# Patient Record
Sex: Female | Born: 1975 | Race: White | Hispanic: No | Smoking: Former smoker
Health system: Southern US, Community
[De-identification: ages and names within clinical notes are randomized; demographics above are authoritative.]

## PROBLEM LIST (undated history)

## (undated) DIAGNOSIS — F419 Anxiety disorder, unspecified: Secondary | ICD-10-CM

## (undated) DIAGNOSIS — I10 Essential (primary) hypertension: Secondary | ICD-10-CM

## (undated) DIAGNOSIS — F32A Depression, unspecified: Secondary | ICD-10-CM

## (undated) HISTORY — PX: TUBAL LIGATION: SHX77

## (undated) HISTORY — PX: BREAST BIOPSY: SHX20

## (undated) HISTORY — DX: Essential (primary) hypertension: I10

## (undated) HISTORY — PX: ABDOMINAL HYSTERECTOMY: SHX81

## (undated) HISTORY — PX: PARTIAL HYSTERECTOMY: SHX80

---

## 2011-01-01 ENCOUNTER — Other Ambulatory Visit: Payer: Self-pay | Admitting: Unknown Physician Specialty

## 2011-01-01 DIAGNOSIS — Z1231 Encounter for screening mammogram for malignant neoplasm of breast: Secondary | ICD-10-CM

## 2011-01-01 DIAGNOSIS — Z803 Family history of malignant neoplasm of breast: Secondary | ICD-10-CM

## 2011-01-17 ENCOUNTER — Ambulatory Visit
Admission: RE | Admit: 2011-01-17 | Discharge: 2011-01-17 | Disposition: A | Discharge status: Home / Self Care | Payer: BC Managed Care – PPO | Source: Ambulatory Visit | Attending: Unknown Physician Specialty | Admitting: Unknown Physician Specialty

## 2011-01-17 DIAGNOSIS — Z803 Family history of malignant neoplasm of breast: Secondary | ICD-10-CM

## 2011-01-17 DIAGNOSIS — Z1231 Encounter for screening mammogram for malignant neoplasm of breast: Secondary | ICD-10-CM

## 2011-01-17 MED ORDER — GADOBENATE DIMEGLUMINE 529 MG/ML IV SOLN
14.0000 mL | Freq: Once | INTRAVENOUS | Status: AC | PRN
  Administered 2011-01-17: 14 mL via INTRAVENOUS

## 2016-01-16 ENCOUNTER — Encounter: Payer: Self-pay | Admitting: Internal Medicine

## 2016-01-29 ENCOUNTER — Encounter: Payer: Self-pay | Admitting: Nurse Practitioner

## 2016-01-29 ENCOUNTER — Ambulatory Visit (INDEPENDENT_AMBULATORY_CARE_PROVIDER_SITE_OTHER): Payer: Commercial Managed Care - HMO | Admitting: Nurse Practitioner

## 2016-01-29 DIAGNOSIS — R140 Abdominal distension (gaseous): Secondary | ICD-10-CM | POA: Diagnosis not present

## 2016-01-29 DIAGNOSIS — R1084 Generalized abdominal pain: Secondary | ICD-10-CM

## 2016-01-29 DIAGNOSIS — R109 Unspecified abdominal pain: Secondary | ICD-10-CM | POA: Insufficient documentation

## 2016-01-29 DIAGNOSIS — R110 Nausea: Secondary | ICD-10-CM

## 2016-01-29 LAB — CBC WITH DIFFERENTIAL/PLATELET
BASOS ABS: 0 {cells}/uL (ref 0–200)
Basophils Relative: 0 %
EOS ABS: 69 {cells}/uL (ref 15–500)
Eosinophils Relative: 1 %
HEMATOCRIT: 38 % (ref 35.0–45.0)
HEMOGLOBIN: 12.9 g/dL (ref 11.7–15.5)
LYMPHS ABS: 1725 {cells}/uL (ref 850–3900)
LYMPHS PCT: 25 %
MCH: 29.8 pg (ref 27.0–33.0)
MCHC: 33.9 g/dL (ref 32.0–36.0)
MCV: 87.8 fL (ref 80.0–100.0)
MONO ABS: 414 {cells}/uL (ref 200–950)
MPV: 9.8 fL (ref 7.5–12.5)
Monocytes Relative: 6 %
NEUTROS PCT: 68 %
Neutro Abs: 4692 cells/uL (ref 1500–7800)
Platelets: 298 10*3/uL (ref 140–400)
RBC: 4.33 MIL/uL (ref 3.80–5.10)
RDW: 13.1 % (ref 11.0–15.0)
WBC: 6.9 10*3/uL (ref 3.8–10.8)

## 2016-01-29 NOTE — Assessment & Plan Note (Signed)
The patient complains of right upper quadrant pain. On exam tenderness is noted right side, right lower quadrant. No epigastric tenderness. Was given PPI which she said did not improve but then later admitted that she does not really take it. Takes over-the-counter ranitidine instead. Takes NSAIDs about 2-3 times a week, no aspirin powders. Does consume some dairy but not significant amount. Has persistent bloating as well. She has an increased frequency in stools since this all started whereas she previously had 2-3 bowel movements a week now 52-3 a day, stools are softer in consistency. No heme noted in her blood. No family history of colon cancer. Abdomen and pelvis CT normal, right upper quadrant ultrasound normal, HIDA scan with 90% ejection fraction. All workup to this point has been essentially benign.  Possible differentials include gluten sensitivity, lactose intolerance, upper GI erosions, gastritis, esophagitis, colon abnormality.   At this point I will check labs including CBC, CMP, tissue transglutaminase IgA, CRP, and an iFob. Recommend she take her PPI as recommended to "given a chance." I'll recommend she avoid dairy as a trial. Return for follow-up in 4 weeks to further evaluate.

## 2016-01-29 NOTE — Assessment & Plan Note (Signed)
Associated bloating with eating, this lungs itself the possibility of dietary intolerance as a component of her problems. We will have her avoid dairy and I'll check a tissue transglutaminase IgA for celiac sensitivity. Return for follow-up in 4 weeks to further evaluate.

## 2016-01-29 NOTE — Progress Notes (Signed)
cc'ed to pcp °

## 2016-01-29 NOTE — Progress Notes (Signed)
Primary Care Physician:  Glenda Chroman, MD Primary Gastroenterologist:  Dr. Gala Romney  Chief Complaint  Patient presents with  . Abdominal Pain    HPI:   Melanie Cochran is a 40 y.o. female who presents On referral from primary care for persistent abdominal pain. The patient last saw primary care on 01/11/2016 for recheck of abdominal pain. Symptoms include abdominal pain and nausea without diarrhea or vaginal discharge. The pain is right upper quadrant described as burning, moderate in severity and unchanged. Symptoms do not include bloody stool. Extensive workup thus far including HIDA scan which calculated all bladder ejection fraction at 90%, abdominal ultrasound with normal right upper quadrant abdominal sonogram with no cholelithiasis, CT abdomen and pelvis findings included no abnormality in the abdomen or pelvis. Labs drawn 05/16/2015 include CBC which is normal and CMP which was mostly normal with the exception of mildly elevated alkaline phosphatase at 120 (high normal 117). Her symptoms do not appear related to food. She was started on a PPI and symptoms continue. She does have a history of hysterectomy.  Today she states she's doing ok overall. Abdominal pain started sometime before March of this year, always right-sided with occasional radiation to her back. PPI has not given her any relief although she isn't taking the PPI. Is taking OTC Ranitidine when the pain is severe. Pain is variable in character, from crampy to burning to sharp . More "annoying" than severe, likely mild to moderate. Some days worse than others and some days no pain until eating then the pain will start. Her "whole life" has had a bowel movement 2-3 times a week and since this started she goes 2-3 times a day, stools are softer as well. Also with nausea but unable to answer if it's related to her abdominal pain. Denies vomiting, hematochezia, melena. Has had low grade (99-100) fever since all this started (states she's  had a low grade fever with every visit to the doctor.) Eats minimal dairy (occasionally in cereal, mac and cheese, or mashed potatoes), occasional cheese. Denies chest pain, dyspnea, dizziness, lightheadedness, syncope, near syncope. Denies any other upper or lower GI symptoms.  Takes NSAIDs about 2-3 times a week, denies ASA powders.   No past medical history on file.  Past Surgical History:  Procedure Laterality Date  . BREAST BIOPSY Right   . CESAREAN SECTION    . PARTIAL HYSTERECTOMY    . TUBAL LIGATION      Current Outpatient Prescriptions  Medication Sig Dispense Refill  . hydrochlorothiazide (HYDRODIURIL) 25 MG tablet      No current facility-administered medications for this visit.     Allergies as of 01/29/2016 - Review Complete 01/29/2016  Allergen Reaction Noted  . Penicillins Other (See Comments) 01/29/2016    Family History  Problem Relation Age of Onset  . Colon cancer Neg Hx     Social History   Social History  . Marital status: Divorced    Spouse name: N/A  . Number of children: N/A  . Years of education: N/A   Occupational History  . Not on file.   Social History Main Topics  . Smoking status: Former Smoker    Quit date: 05/16/2015  . Smokeless tobacco: Never Used  . Alcohol use Yes     Comment: socially  . Drug use: No  . Sexual activity: Not on file   Other Topics Concern  . Not on file   Social History Narrative  . No narrative on  file    Review of Systems: General: Negative for anorexia, weight loss, fever, chills, fatigue, weakness. ENT: Negative for hoarseness, difficulty swallowing , nasal congestion. CV: Negative for chest pain, angina, palpitations, peripheral edema.  Respiratory: Negative for dyspnea at rest, cough, sputum, wheezing.  GI: See history of present illness. GU:  Negative for dysuria, hematuria.  MS: Negative for joint pain, low back pain.  Derm: Negative for rash or itching.  Endo: Negative for unusual weight  change.  Heme: Negative for bruising or bleeding. Allergy: Negative for rash or hives.    Physical Exam: BP (!) 136/93   Pulse 79   Temp 98.3 F (36.8 C) (Oral)   Ht 5\' 6"  (1.676 m)   Wt 182 lb 6.4 oz (82.7 kg)   BMI 29.44 kg/m  General:   Alert and oriented. Pleasant and cooperative. Well-nourished and well-developed.  Head:  Normocephalic and atraumatic. Eyes:  Without icterus, sclera clear and conjunctiva pink.  Ears:  Normal auditory acuity. Cardiovascular:  S1, S2 present without murmurs appreciated. Extremities without clubbing or edema. Respiratory:  Clear to auscultation bilaterally. No wheezes, rales, or rhonchi. No distress.  Gastrointestinal:  +BS, soft, and non-distended. Mild TTP RLQ and right flank area; no TTP epigastrum or RUQ. No HSM noted. No guarding or rebound. No masses appreciated.  Rectal:  Deferred  Musculoskalatal:  Symmetrical without gross deformities. Skin:  Intact without significant lesions or rashes. Neurologic:  Alert and oriented x4;  grossly normal neurologically. Psych:  Alert and cooperative. Normal mood and affect. Heme/Lymph/Immune: No excessive bruising noted.    01/29/2016 9:01 AM   Disclaimer: This note was dictated with voice recognition software. Similar sounding words can inadvertently be transcribed and may not be corrected upon review.

## 2016-01-29 NOTE — Patient Instructions (Signed)
1. Other labs drawn when you're able to. 2. When you collect the stool sample bring her back to our office. 3. Try to avoid all dairy for the next 4 weeks. 4. I recommend you take the acid blocker your prescribed by your primary care provider daily. This will help [place] to rule out any kind of stomach/esophageal problems that could be contributing to your symptoms. 5. Return for follow-up in 4 weeks

## 2016-01-29 NOTE — Assessment & Plan Note (Signed)
He does complain of nausea as an associated symptom, no vomiting. This lends the possibility of at least a component of her symptoms being related to GERD, esophagitis, or gastritis. I recommend she take her PPIs recommended, avoid NSAIDs.

## 2016-01-30 LAB — HEPATIC FUNCTION PANEL
ALBUMIN: 4.2 g/dL (ref 3.6–5.1)
ALT: 14 U/L (ref 6–29)
AST: 14 U/L (ref 10–30)
Alkaline Phosphatase: 91 U/L (ref 33–115)
BILIRUBIN DIRECT: 0.1 mg/dL (ref <=0.2)
Indirect Bilirubin: 0.4 mg/dL (ref 0.2–1.2)
TOTAL PROTEIN: 7.1 g/dL (ref 6.1–8.1)
Total Bilirubin: 0.5 mg/dL (ref 0.2–1.2)

## 2016-01-30 LAB — C-REACTIVE PROTEIN: CRP: 11.7 mg/L — AB (ref <8.0)

## 2016-02-04 ENCOUNTER — Ambulatory Visit (INDEPENDENT_AMBULATORY_CARE_PROVIDER_SITE_OTHER): Payer: Commercial Managed Care - HMO

## 2016-02-04 DIAGNOSIS — R109 Unspecified abdominal pain: Secondary | ICD-10-CM | POA: Diagnosis not present

## 2016-02-04 LAB — IFOBT (OCCULT BLOOD): IFOBT: NEGATIVE

## 2016-02-05 ENCOUNTER — Encounter: Payer: Self-pay | Admitting: Nurse Practitioner

## 2016-02-07 LAB — HLA TYPING FOR CELIAC DISEASE
HLA-DQ2: NEGATIVE
HLA-DQ8: NEGATIVE
HLA-DQA1*: 1
HLA-DQA1: 1
HLA-DQB1*: 501
HLA-DQB1: 603

## 2016-02-26 ENCOUNTER — Encounter: Payer: Self-pay | Admitting: Internal Medicine

## 2016-02-26 ENCOUNTER — Ambulatory Visit (INDEPENDENT_AMBULATORY_CARE_PROVIDER_SITE_OTHER): Payer: Commercial Managed Care - HMO | Admitting: Internal Medicine

## 2016-02-26 VITALS — BP 120/86 | HR 77 | Temp 98.1°F | Ht 66.0 in | Wt 181.4 lb

## 2016-02-26 DIAGNOSIS — R140 Abdominal distension (gaseous): Secondary | ICD-10-CM

## 2016-02-26 DIAGNOSIS — G8929 Other chronic pain: Secondary | ICD-10-CM | POA: Diagnosis not present

## 2016-02-26 DIAGNOSIS — R1011 Right upper quadrant pain: Secondary | ICD-10-CM | POA: Diagnosis not present

## 2016-02-26 NOTE — Progress Notes (Signed)
Primary Care Physician:  Glenda Chroman, MD Primary Gastroenterologist:  Dr. Gala Romney  Pre-Procedure History & Physical: HPI:  Melanie Cochran is a 40 y.o. female here for follow-up of bloating, postprandial right upper quadrant abdominal pain. Gallbladder workup negative. No improvement with PPI therapy. He has 2-3 bowel movements weekly. Does not perceive constipation. Associated nausea but no vomiting, no bleeding. CRP mildly elevated 11.7. Celiac markers in the way of HLA testing negative. Mildly elevated ALP which reverted to normal.  Past Medical History:  Diagnosis Date  . Hypertension     Past Surgical History:  Procedure Laterality Date  . BREAST BIOPSY Right   . CESAREAN SECTION    . PARTIAL HYSTERECTOMY    . TUBAL LIGATION      Prior to Admission medications   Medication Sig Start Date End Date Taking? Authorizing Provider  hydrochlorothiazide (HYDRODIURIL) 25 MG tablet  01/21/16  Yes Historical Provider, MD  Probiotic Product (PROBIOTIC DAILY PO) Take by mouth daily.   Yes Historical Provider, MD  pantoprazole (PROTONIX) 40 MG tablet  01/11/16   Historical Provider, MD    Allergies as of 02/26/2016 - Review Complete 02/26/2016  Allergen Reaction Noted  . Penicillins Other (See Comments) 01/29/2016    Family History  Problem Relation Age of Onset  . Colon cancer Neg Hx     Social History   Social History  . Marital status: Divorced    Spouse name: N/A  . Number of children: N/A  . Years of education: N/A   Occupational History  . Not on file.   Social History Main Topics  . Smoking status: Former Smoker    Quit date: 05/16/2015  . Smokeless tobacco: Never Used  . Alcohol use Yes     Comment: socially  . Drug use: No  . Sexual activity: Not on file   Other Topics Concern  . Not on file   Social History Narrative  . No narrative on file    Review of Systems: See HPI, otherwise negative ROS  Physical Exam: BP 120/86   Pulse 77   Temp 98.1 F  (36.7 C) (Oral)   Ht 5' 6" (1.676 m)   Wt 181 lb 6.4 oz (82.3 kg)   BMI 29.28 kg/m  General:   Alert,  Well-developed, well-nourished, pleasant and cooperative in NAD Skin:  Intact without significant lesions or rashes. Eyes:  Sclera clear, no icterus.   Conjunctiva pink. Neck:  Supple; no masses or thyromegaly. No significant cervical adenopathy. Lungs:  Clear throughout to auscultation.   No wheezes, crackles, or rhonchi. No acute distress. Heart:  Regular rate and rhythm; no murmurs, clicks, rubs,  or gallops. Abdomen: Non-distended, normal bowel sounds.  Soft and nontender without appreciable mass or hepatosplenomegaly.  Pulses:  Normal pulses noted. Extremities:  Without clubbing or edema.  Impression: 40 year old lady with postprandial right upper quadrant abdominal pain. Bowel function a bit sluggish but no change perceived from lifetime baseline.. She tends to have about 3 bowel movements weekly. She may not be actual evacuating adequately. However. I doubt food allergies.  Gallbladder etiology not absolutely ruled out despite a negative ultrasound, HIDA.  Recommendations:   Trial of Linzess 72 - one daily x 3 weeks to see a better evacuation makes any difference in her symptoms.  If no improvement, will need an EGD prior to referral for potential cholecystectomy as the next diagnostic maneuver.  Further recommendations to follow    Notice: This dictation was prepared  with Dragon dictation along with smaller phrase technology. Any transcriptional errors that result from this process are unintentional and may not be corrected upon review.

## 2016-02-26 NOTE — Patient Instructions (Signed)
Trial of Linzess 72 - one daily x 3 weeks  If no improvement, will need an EGD prior to referral for potential cholecystectomy  Further recommendations to follow

## 2016-03-05 ENCOUNTER — Telehealth: Payer: Self-pay

## 2016-03-05 ENCOUNTER — Encounter: Payer: Self-pay | Admitting: Internal Medicine

## 2016-03-05 NOTE — Telephone Encounter (Signed)
Sent pt a message via mychart. Ginger, please schedule EGD.

## 2016-03-05 NOTE — Telephone Encounter (Signed)
Schedule EGD

## 2016-03-05 NOTE — Telephone Encounter (Signed)
Pt sent a mychart message, she said the linzess hasnt helped her. She wants to know if she can go ahead and schedule an EGD. She has almost met her deductible for the year and would like to go ahead and have it done.   Is it ok to schedule EGD?

## 2016-03-06 ENCOUNTER — Other Ambulatory Visit: Payer: Self-pay

## 2016-03-06 DIAGNOSIS — R1011 Right upper quadrant pain: Secondary | ICD-10-CM

## 2016-03-06 NOTE — Telephone Encounter (Signed)
Pt is set up on 03/12/16 she is aware and instructions are in the mail

## 2016-03-11 ENCOUNTER — Telehealth: Payer: Self-pay

## 2016-03-11 NOTE — Telephone Encounter (Signed)
PA #  [id number]

## 2016-03-12 ENCOUNTER — Ambulatory Visit (HOSPITAL_COMMUNITY)
Admission: RE | Admit: 2016-03-12 | Discharge: 2016-03-12 | Disposition: A | Discharge status: Home / Self Care | Payer: Commercial Managed Care - HMO | Source: Ambulatory Visit | Attending: Internal Medicine | Admitting: Internal Medicine

## 2016-03-12 ENCOUNTER — Encounter (HOSPITAL_COMMUNITY)
Admission: RE | Disposition: A | Discharge status: Home / Self Care | Payer: Self-pay | Source: Ambulatory Visit | Attending: Internal Medicine

## 2016-03-12 ENCOUNTER — Encounter (HOSPITAL_COMMUNITY): Payer: Self-pay | Admitting: *Deleted

## 2016-03-12 ENCOUNTER — Telehealth: Payer: Self-pay

## 2016-03-12 ENCOUNTER — Other Ambulatory Visit: Payer: Self-pay

## 2016-03-12 DIAGNOSIS — Z79899 Other long term (current) drug therapy: Secondary | ICD-10-CM | POA: Diagnosis not present

## 2016-03-12 DIAGNOSIS — Z9889 Other specified postprocedural states: Secondary | ICD-10-CM | POA: Diagnosis not present

## 2016-03-12 DIAGNOSIS — R1011 Right upper quadrant pain: Secondary | ICD-10-CM

## 2016-03-12 DIAGNOSIS — Z880 Allergy status to penicillin: Secondary | ICD-10-CM | POA: Diagnosis not present

## 2016-03-12 DIAGNOSIS — K5900 Constipation, unspecified: Secondary | ICD-10-CM | POA: Diagnosis not present

## 2016-03-12 DIAGNOSIS — Z90710 Acquired absence of both cervix and uterus: Secondary | ICD-10-CM | POA: Insufficient documentation

## 2016-03-12 DIAGNOSIS — Z87891 Personal history of nicotine dependence: Secondary | ICD-10-CM | POA: Diagnosis not present

## 2016-03-12 DIAGNOSIS — R1013 Epigastric pain: Secondary | ICD-10-CM | POA: Diagnosis not present

## 2016-03-12 DIAGNOSIS — I10 Essential (primary) hypertension: Secondary | ICD-10-CM | POA: Insufficient documentation

## 2016-03-12 HISTORY — PX: ESOPHAGOGASTRODUODENOSCOPY: SHX5428

## 2016-03-12 SURGERY — EGD (ESOPHAGOGASTRODUODENOSCOPY)
Anesthesia: Moderate Sedation

## 2016-03-12 MED ORDER — MIDAZOLAM HCL 5 MG/5ML IJ SOLN
INTRAMUSCULAR | Status: AC
  Filled 2016-03-12: qty 10

## 2016-03-12 MED ORDER — MIDAZOLAM HCL 5 MG/5ML IJ SOLN
INTRAMUSCULAR | Status: DC | PRN
  Administered 2016-03-12: 1 mg via INTRAVENOUS
  Administered 2016-03-12 (×2): 2 mg via INTRAVENOUS

## 2016-03-12 MED ORDER — LIDOCAINE VISCOUS 2 % MT SOLN
OROMUCOSAL | Status: AC
  Filled 2016-03-12: qty 15

## 2016-03-12 MED ORDER — SODIUM CHLORIDE 0.9 % IV SOLN
INTRAVENOUS | Status: DC
  Administered 2016-03-12: 1000 mL via INTRAVENOUS

## 2016-03-12 MED ORDER — ONDANSETRON HCL 4 MG/2ML IJ SOLN
INTRAMUSCULAR | Status: DC | PRN
  Administered 2016-03-12: 4 mg via INTRAVENOUS

## 2016-03-12 MED ORDER — LIDOCAINE VISCOUS 2 % MT SOLN
OROMUCOSAL | Status: DC | PRN
  Administered 2016-03-12: 3 mL via OROMUCOSAL

## 2016-03-12 MED ORDER — ONDANSETRON HCL 4 MG/2ML IJ SOLN
INTRAMUSCULAR | Status: AC
  Filled 2016-03-12: qty 2

## 2016-03-12 MED ORDER — SIMETHICONE 40 MG/0.6ML PO SUSP
ORAL | Status: DC | PRN
  Administered 2016-03-12: 09:00:00

## 2016-03-12 MED ORDER — MEPERIDINE HCL 100 MG/ML IJ SOLN
INTRAMUSCULAR | Status: DC | PRN
  Administered 2016-03-12 (×2): 50 mg via INTRAVENOUS

## 2016-03-12 MED ORDER — MEPERIDINE HCL 100 MG/ML IJ SOLN
INTRAMUSCULAR | Status: AC
  Filled 2016-03-12: qty 2

## 2016-03-12 NOTE — Telephone Encounter (Signed)
Referral info faxed to Dr. Jenkins office. 

## 2016-03-12 NOTE — Op Note (Signed)
[facility] Patient Name: Melanie Cochran Procedure Date: 03/12/2016 8:58 AM MRN: [id number] Date of Birth: [birth year] Attending MD: Norvel Richards , MD CSN: [id number] Age: 40 Admit Type: Outpatient Procedure:                Upper GI endoscopy Indications:              Epigastric abdominal pain, Abdominal pain in the                            right upper quadrant Providers:                Norvel Richards, MD Referring MD:              Medicines:                Midazolam 5 mg IV, Meperidine 100 mg IV,                            Ondansetron 4 mg IV Complications:            No immediate complications. Estimated Blood Loss:     Estimated blood loss: none. Procedure:                Pre-Anesthesia Assessment:                           - Prior to the procedure, a History and Physical                            was performed, and patient medications and                            allergies were reviewed. The patient's tolerance of                            previous anesthesia was also reviewed. The risks                            and benefits of the procedure and the sedation                            options and risks were discussed with the patient.                            All questions were answered, and informed consent                            was obtained. Prior Anticoagulants: The patient has                            taken no previous anticoagulant or antiplatelet                            agents. ASA Grade Assessment: II - A patient with  mild systemic disease. After reviewing the risks                            and benefits, the patient was deemed in                            satisfactory condition to undergo the procedure.                           After obtaining informed consent, the endoscope was                            passed under direct vision. Throughout the                            procedure, the patient's blood  pressure, pulse, and                            oxygen saturations were monitored continuously.The                            upper GI endoscopy was accomplished without                            difficulty. The patient tolerated the procedure                            well. The [id number]) scope was introduced                            through the and advanced to the second part of                            duodenum. Scope In: Scope Out: Findings:      The examined esophagus was normal.      The entire examined stomach was normal.      The duodenal bulb and second portion of the duodenum were normal. Impression:               - Normal esophagus.                           - Normal stomach.                           - Normal duodenal bulb and second portion of the                            duodenum.                           - No specimens collected. Patient remains                            constipated. Low-dose Linzess did not help. Before  sending her to a surgeon for consideration of                            cholecystectomy as a DIAGNOSTIC maneuver, would                            like to see if more effective management of                            constipation diminishes her abdominal pain. Moderate Sedation:      Moderate (conscious) sedation was administered by the endoscopy nurse       and supervised by the endoscopist. The following parameters were       monitored: oxygen saturation, heart rate, blood pressure, respiratory       rate, EKG, adequacy of pulmonary ventilation, and response to care.       Total physician intraservice time was 14 minutes. Recommendation:           - Patient has a contact number available for                            emergencies. The signs and symptoms of potential                            delayed complications were discussed with the                            patient. Return to normal activities  tomorrow.                            Written discharge instructions were provided to the                            patient.                           - Resume previous diet.                           - Continue present medications. Increase Linzess to                            145 daily; go by my office for samples; appointment                            to see Dr. Arnoldo Morale in about 2 weeks for evaluation                            for possible cholecystectomy if needed.                           - No repeat upper endoscopy.                           - Procedure Code(s):        ---  Professional ---                           [phone], Moderate sedation services provided by the                            same physician or other qualified health care                            professional performing the diagnostic or                            therapeutic service that the sedation supports,                            requiring the presence of an independent trained                            observer to assist in the monitoring of the                            patient's level of consciousness and physiological                            status; initial 15 minutes of intraservice time,                            patient age 82 years or older Diagnosis Code(s):        --- Professional ---                           R10.13, Epigastric pain                           R10.11, Right upper quadrant pain CPT copyright 2016 [employer] Medical Association. All rights reserved. The codes documented in this report are preliminary and upon coder review may  be revised to meet current compliance requirements. Cristopher Estimable. Melanie Duclos, MD Norvel Richards, MD 03/12/2016 12:49:12 PM This report has been signed electronically. Number of Addenda: 0

## 2016-03-12 NOTE — Telephone Encounter (Signed)
Tammy from Endo called- pt needs a consult with Dr.Jenkins in 7-10 days for possible cholecystectomy.   Pt also needs linzess 1100mcg samples. #4 boxes are at the front desk for pt to pick up.

## 2016-03-12 NOTE — H&P (View-Only) (Signed)
Primary Care Physician:  Glenda Chroman, MD Primary Gastroenterologist:  Dr. Gala Romney  Pre-Procedure History & Physical: HPI:  Melanie Cochran is a 40 y.o. female here for follow-up of bloating, postprandial right upper quadrant abdominal pain. Gallbladder workup negative. No improvement with PPI therapy. He has 2-3 bowel movements weekly. Does not perceive constipation. Associated nausea but no vomiting, no bleeding. CRP mildly elevated 11.7. Celiac markers in the way of HLA testing negative. Mildly elevated ALP which reverted to normal.  Past Medical History:  Diagnosis Date  . Hypertension     Past Surgical History:  Procedure Laterality Date  . BREAST BIOPSY Right   . CESAREAN SECTION    . PARTIAL HYSTERECTOMY    . TUBAL LIGATION      Prior to Admission medications   Medication Sig Start Date End Date Taking? Authorizing Provider  hydrochlorothiazide (HYDRODIURIL) 25 MG tablet  01/21/16  Yes Historical Provider, MD  Probiotic Product (PROBIOTIC DAILY PO) Take by mouth daily.   Yes Historical Provider, MD  pantoprazole (PROTONIX) 40 MG tablet  01/11/16   Historical Provider, MD    Allergies as of 02/26/2016 - Review Complete 02/26/2016  Allergen Reaction Noted  . Penicillins Other (See Comments) 01/29/2016    Family History  Problem Relation Age of Onset  . Colon cancer Neg Hx     Social History   Social History  . Marital status: Divorced    Spouse name: N/A  . Number of children: N/A  . Years of education: N/A   Occupational History  . Not on file.   Social History Main Topics  . Smoking status: Former Smoker    Quit date: 05/16/2015  . Smokeless tobacco: Never Used  . Alcohol use Yes     Comment: socially  . Drug use: No  . Sexual activity: Not on file   Other Topics Concern  . Not on file   Social History Narrative  . No narrative on file    Review of Systems: See HPI, otherwise negative ROS  Physical Exam: BP 120/86   Pulse 77   Temp 98.1 F  (36.7 C) (Oral)   Ht 5' 6" (1.676 m)   Wt 181 lb 6.4 oz (82.3 kg)   BMI 29.28 kg/m  General:   Alert,  Well-developed, well-nourished, pleasant and cooperative in NAD Skin:  Intact without significant lesions or rashes. Eyes:  Sclera clear, no icterus.   Conjunctiva pink. Neck:  Supple; no masses or thyromegaly. No significant cervical adenopathy. Lungs:  Clear throughout to auscultation.   No wheezes, crackles, or rhonchi. No acute distress. Heart:  Regular rate and rhythm; no murmurs, clicks, rubs,  or gallops. Abdomen: Non-distended, normal bowel sounds.  Soft and nontender without appreciable mass or hepatosplenomegaly.  Pulses:  Normal pulses noted. Extremities:  Without clubbing or edema.  Impression: 40 year old lady with postprandial right upper quadrant abdominal pain. Bowel function a bit sluggish but no change perceived from lifetime baseline.. She tends to have about 3 bowel movements weekly. She may not be actual evacuating adequately. However. I doubt food allergies.  Gallbladder etiology not absolutely ruled out despite a negative ultrasound, HIDA.  Recommendations:   Trial of Linzess 72 - one daily x 3 weeks to see a better evacuation makes any difference in her symptoms.  If no improvement, will need an EGD prior to referral for potential cholecystectomy as the next diagnostic maneuver.  Further recommendations to follow    Notice: This dictation was prepared  with Dragon dictation along with smaller phrase technology. Any transcriptional errors that result from this process are unintentional and may not be corrected upon review.

## 2016-03-12 NOTE — Discharge Instructions (Addendum)
EGD Discharge instructions Please read the instructions outlined below and refer to this sheet in the next few weeks. These discharge instructions provide you with general information on caring for yourself after you leave the hospital. Your doctor may also give you specific instructions. While your treatment has been planned according to the most current medical practices available, unavoidable complications occasionally occur. If you have any problems or questions after discharge, please call your doctor. ACTIVITY  You may resume your regular activity but move at a slower pace for the next 24 hours.   Take frequent rest periods for the next 24 hours.   Walking will help expel (get rid of) the air and reduce the bloated feeling in your abdomen.   No driving for 24 hours (because of the anesthesia (medicine) used during the test).   You may shower.   Do not sign any important legal documents or operate any machinery for 24 hours (because of the anesthesia used during the test).  NUTRITION  Drink plenty of fluids.   You may resume your normal diet.   Begin with a light meal and progress to your normal diet.   Avoid alcoholic beverages for 24 hours or as instructed by your caregiver.  MEDICATIONS  You may resume your normal medications unless your caregiver tells you otherwise.  WHAT YOU CAN EXPECT TODAY  You may experience abdominal discomfort such as a feeling of fullness or gas pains.  FOLLOW-UP  Your doctor will discuss the results of your test with you.  SEEK IMMEDIATE MEDICAL ATTENTION IF ANY OF THE FOLLOWING OCCUR:  Excessive nausea (feeling sick to your stomach) and/or vomiting.   Severe abdominal pain and distention (swelling).   Trouble swallowing.   Temperature over 101 F (37.8 C).   Rectal bleeding or vomiting of blood.   Increase Linzess to 145 daily for the next 7-10 days to see if this helps with constipation and upper abdominal pain. Go to the office  for free samples.  Will arrange an appointment to see Dr. Aviva Signs in about 7-10 days for consideration of cholecystectomy if needed.

## 2016-03-12 NOTE — Interval H&P Note (Signed)
History and Physical Interval Note:  03/12/2016 9:05 AM  Leone Brand  has presented today for surgery, with the diagnosis of potentrial cholecystectomy/RUQ PAIN  The various methods of treatment have been discussed with the patient and family. After consideration of risks, benefits and other options for treatment, the patient has consented to  Procedure(s) with comments: ESOPHAGOGASTRODUODENOSCOPY (EGD) (N/A) - [id number] as a surgical intervention .  The patient's history has been reviewed, patient examined, no change in status, stable for surgery.  I have reviewed the patient's chart and labs.  Questions were answered to the patient's satisfaction.     Remains constipated in spite of Linzess 72. Diagnostic EGD today per plan. The risks, benefits, limitations, alternatives and imponderables have been reviewed with the patient. Potential for esophageal dilation, biopsy, etc. have also been reviewed.  Questions have been answered. All parties agreeable.  Manus Rudd

## 2016-03-14 ENCOUNTER — Encounter (HOSPITAL_COMMUNITY): Payer: Self-pay | Admitting: Internal Medicine

## 2016-03-17 ENCOUNTER — Encounter: Payer: Self-pay | Admitting: Internal Medicine

## 2016-03-27 ENCOUNTER — Encounter: Payer: Self-pay | Admitting: Internal Medicine

## 2016-04-01 ENCOUNTER — Ambulatory Visit (INDEPENDENT_AMBULATORY_CARE_PROVIDER_SITE_OTHER): Payer: Commercial Managed Care - HMO | Admitting: Internal Medicine

## 2016-04-01 ENCOUNTER — Encounter: Payer: Self-pay | Admitting: Internal Medicine

## 2016-04-01 VITALS — BP 132/91 | HR 73 | Temp 98.1°F | Ht 66.0 in | Wt 184.2 lb

## 2016-04-01 DIAGNOSIS — G8929 Other chronic pain: Secondary | ICD-10-CM

## 2016-04-01 DIAGNOSIS — K9289 Other specified diseases of the digestive system: Secondary | ICD-10-CM | POA: Diagnosis not present

## 2016-04-01 DIAGNOSIS — K219 Gastro-esophageal reflux disease without esophagitis: Secondary | ICD-10-CM

## 2016-04-01 DIAGNOSIS — R1011 Right upper quadrant pain: Secondary | ICD-10-CM

## 2016-04-01 MED ORDER — HYOSCYAMINE SULFATE 0.125 MG SL SUBL: 0.1250 mg | SUBLINGUAL_TABLET | Freq: Three times a day (TID) | SUBLINGUAL | 1 refills | Status: DC

## 2016-04-01 NOTE — Patient Instructions (Addendum)
Stop Protonix; Begin Dexilant 60 mg daily - samples provided   Use a probiotic for gas / bloat symptoms   May stop Linzess   Trial of Levsin .125 mg tablets - one SL before meals and at bedtime as needed for symptoms   We will go ahead and make an appointment at [facility] (GI) for second opinion in about 3-4  Weeks  OV here in 3 weeks.

## 2016-04-01 NOTE — Progress Notes (Signed)
Primary Care Physician:  Glenda Chroman, MD Primary Gastroenterologist:  Dr. Gala Romney  Pre-Procedure History & Physical: HPI:  Melanie Cochran is a 40 y.o. female here for ongoing evaluation of right upper quadrant bowel pain of almost 1 year's duration. She does have associated abdominal bloating from time to time. Bowel movement frequency of one every other day - now increased to the point of diarrhea daily with low-dose Linzess. This was associated with no improvement in her GI symptoms. She tells me Protonix has helped her typical reflux symptoms with no effect on her right upper quadrant abdominal pain.  Saw Dr. Arnoldo Morale for consideration of cholecystectomy. He felt it would be best to hold off until maximal medical therapy was found to be unsuccessful.  Patient never developed a rash. Does not have a radicular component. Sometimes brought on with a meal;  sometimes not. Is not lost any weight. Noncontrast CT, ultrasound HIDA EGD unrevealing as to  cause of her pain. Her gallbladder appears normal and functions normally by HIDA. Had a mildly elevated CRP previously mildly elevated alkaline phosphatase. LFTs reverted to normal. Normal CBC.  Past Medical History:  Diagnosis Date  . Hypertension     Past Surgical History:  Procedure Laterality Date  . ABDOMINAL HYSTERECTOMY    . BREAST BIOPSY Right   . CESAREAN SECTION    . ESOPHAGOGASTRODUODENOSCOPY N/A 03/12/2016   Procedure: ESOPHAGOGASTRODUODENOSCOPY (EGD);  Surgeon: Daneil Dolin, MD;  [facility];  Service: Endoscopy;  Laterality: N/A;  915  . PARTIAL HYSTERECTOMY    . TUBAL LIGATION      Prior to Admission medications   Medication Sig Start Date End Date Taking? Authorizing Provider  acetaminophen (TYLENOL) 500 MG tablet Take 1,000 mg by mouth every 6 (six) hours as needed (for pain.).    Yes Historical Provider, MD  hydrochlorothiazide (HYDRODIURIL) 25 MG tablet Take 25 mg by mouth daily.  01/21/16  Yes Historical  Provider, MD  ibuprofen (ADVIL,MOTRIN) 200 MG tablet Take 400-600 mg by mouth every 8 (eight) hours as needed (for pain.).   Yes Historical Provider, MD  pantoprazole (PROTONIX) 40 MG tablet Take 40 mg by mouth daily.  01/11/16  Yes Historical Provider, MD  Probiotic Product (PROBIOTIC DAILY PO) Take 1 capsule by mouth daily.    Yes Historical Provider, MD  ST JOHNS WORT PO Take 1 capsule by mouth daily.    Historical Provider, MD    Allergies as of 04/01/2016 - Review Complete 04/01/2016  Allergen Reaction Noted  . Penicillins Other (See Comments) 01/29/2016    Family History  Problem Relation Age of Onset  . Breast cancer Mother   . Hypertension Mother   . Hypertension Father   . Colon cancer Neg Hx     Social History   Social History  . Marital status: Divorced    Spouse name: N/A  . Number of children: N/A  . Years of education: N/A   Occupational History  . Not on file.   Social History Main Topics  . Smoking status: Former Smoker    Quit date: 05/16/2015  . Smokeless tobacco: Never Used  . Alcohol use Yes     Comment: socially  . Drug use: No  . Sexual activity: Not on file   Other Topics Concern  . Not on file   Social History Narrative  . No narrative on file    Review of Systems: See HPI, otherwise negative ROS  Physical Exam: BP Marland Kitchen)  132/91   Pulse 73   Temp 98.1 F (36.7 C) (Oral)   Ht 5\' 6"  (1.676 m)   Wt 184 lb 3.2 oz (83.6 kg)   BMI 29.73 kg/m  General:   Alert,  Well-developed, well-nourished, pleasant and cooperative in NAD Skin:  Intact without significant lesions or rashes. Neck:  Supple; no masses or thyromegaly. No significant cervical adenopathy. Lungs:  Clear throughout to auscultation.   No wheezes, crackles, or rhonchi. No acute distress. Heart:  Regular rate and rhythm; no murmurs, clicks, rubs,  or gallops. Abdomen: Nondistended. Positive bowel sounds. Very minimal right upper quadrant discomfort to palpation. No obvious mass or  organomegaly Pulses:  Normal pulses noted. Extremities:  Without clubbing or edema.  Impression:   Pleasant 40 year old lady with a nearly one-year history of intermittent right upper quadrant abdominal pain. Sometimes postprandially related and at other times not. Reflux symptoms well controlled on Protonix but right upper quadrant pain has not been improved with this regimen. Moreover,  her pain was not improved with the addition of Linzess - low dose. This did produce diarrhea but had no effect on pain. Weight has been stable. Findings of EGD, CT scan ultrasound, etc  all reviewed. Nothing on labs to suggest an etiology to her symptomatology.  I suppose she could have a musculoskeletal or neuropathic component.   She certainly never developed a rash in the area of concern.  The previous plan was to proceed with cholecystectomy as a diagnostic maneuver. She saw Dr. Arnoldo Morale.  He did not have a high degree of confidence that her symptoms would resolve with cholecystectomy but did not close the door on that avenue of treatment.   Recommendations:   Let's Stop Protonix; Begin Dexilant 60 mg daily - samples provided.  Use a probiotic for gas / bloat symptoms  Trial of Levsin .125 mg tablets - one SL before meals and at bedtime as needed for symptoms As discussed with the patient,  we will go ahead and make an appointment at [facility] (GI) for second opinion in the coming weeks if needed.   OV here in 3 weeks.      Notice: This dictation was prepared with Dragon dictation along with smaller phrase technology. Any transcriptional errors that result from this process are unintentional and may not be corrected upon review.

## 2016-04-02 NOTE — Patient Instructions (Signed)
Patient has an appointment with Dr. Tally Joe on 06/06/16 @ 11:30 am at [facility]. She is aware.

## 2016-04-15 ENCOUNTER — Encounter: Payer: Self-pay | Admitting: Internal Medicine

## 2016-04-22 ENCOUNTER — Ambulatory Visit: Payer: Commercial Managed Care - HMO | Admitting: Internal Medicine

## 2016-05-13 ENCOUNTER — Encounter: Payer: Self-pay | Admitting: Internal Medicine

## 2016-05-13 ENCOUNTER — Ambulatory Visit (INDEPENDENT_AMBULATORY_CARE_PROVIDER_SITE_OTHER): Payer: Commercial Managed Care - HMO | Admitting: Internal Medicine

## 2016-05-13 VITALS — BP 133/85 | HR 90 | Temp 98.1°F | Ht 66.0 in | Wt 184.2 lb

## 2016-05-13 DIAGNOSIS — K219 Gastro-esophageal reflux disease without esophagitis: Secondary | ICD-10-CM | POA: Diagnosis not present

## 2016-05-13 DIAGNOSIS — R1011 Right upper quadrant pain: Secondary | ICD-10-CM

## 2016-05-13 DIAGNOSIS — G8929 Other chronic pain: Secondary | ICD-10-CM

## 2016-05-13 NOTE — Patient Instructions (Signed)
Continue Dexilant 60 mg daily - samples provided  Call in 3 weeks for a progress report. We'll then decide about appointment at [facility]  GERD information provided  OV here in 3 months

## 2016-05-13 NOTE — Progress Notes (Signed)
Primary Care Physician:  Glenda Chroman, MD Primary Gastroenterologist:  Dr. Gala Romney   Pre-Procedure History & Physical: HPI:  Melanie Cochran is a 41 y.o. female here for follow-up right upper quadrant abdominal pain/bloating. Gallbladder workup negative. Switch from Protonix to Dexilant- 60 mg daily for 2 weeks. All of her symptoms settled down significantly. Cut back on carbonated soft drinks. She ran out of samples. With the holidays and dietary indiscretion, symptoms returned. She does have an appointment to see the folks at [facility] for second opinion on January 26.  Past Medical History:  Diagnosis Date  . Hypertension     Past Surgical History:  Procedure Laterality Date  . ABDOMINAL HYSTERECTOMY    . BREAST BIOPSY Right   . CESAREAN SECTION    . ESOPHAGOGASTRODUODENOSCOPY N/A 03/12/2016   Procedure: ESOPHAGOGASTRODUODENOSCOPY (EGD);  Surgeon: Daneil Dolin, MD;  [facility];  Service: Endoscopy;  Laterality: N/A;  915  . PARTIAL HYSTERECTOMY    . TUBAL LIGATION      Prior to Admission medications   Medication Sig Start Date End Date Taking? Authorizing Provider  acetaminophen (TYLENOL) 500 MG tablet Take 1,000 mg by mouth every 6 (six) hours as needed (for pain.).    Yes Historical Provider, MD  dexlansoprazole (DEXILANT) 60 MG capsule Take 60 mg by mouth daily. Has been out of samples for 2 weeks   Yes Historical Provider, MD  hydrochlorothiazide (HYDRODIURIL) 25 MG tablet Take 25 mg by mouth daily.  01/21/16  Yes Historical Provider, MD  ibuprofen (ADVIL,MOTRIN) 200 MG tablet Take 400-600 mg by mouth every 8 (eight) hours as needed (for pain.).   Yes Historical Provider, MD  Probiotic Product (PROBIOTIC DAILY PO) Take 1 capsule by mouth daily.    Yes Historical Provider, MD  ST JOHNS WORT PO Take 1 capsule by mouth daily.   Yes Historical Provider, MD  hyoscyamine (LEVSIN SL) 0.125 MG SL tablet Place 1 tablet (0.125 mg total) under the tongue 4 (four) times daily -   before meals and at bedtime. PRN Patient not taking: Reported on 05/13/2016 04/01/16   Daneil Dolin, MD  pantoprazole (PROTONIX) 40 MG tablet Take 40 mg by mouth daily.  01/11/16   Historical Provider, MD    Allergies as of 05/13/2016 - Review Complete 05/13/2016  Allergen Reaction Noted  . Penicillins Other (See Comments) 01/29/2016    Family History  Problem Relation Age of Onset  . Breast cancer Mother   . Hypertension Mother   . Hypertension Father   . Colon cancer Neg Hx     Social History   Social History  . Marital status: Divorced    Spouse name: N/A  . Number of children: N/A  . Years of education: N/A   Occupational History  . Not on file.   Social History Main Topics  . Smoking status: Current Some Day Smoker    Last attempt to quit: 05/16/2015  . Smokeless tobacco: Never Used  . Alcohol use Yes     Comment: socially  . Drug use: No  . Sexual activity: Not on file   Other Topics Concern  . Not on file   Social History Narrative  . No narrative on file    Review of Systems: See HPI, otherwise negative ROS  Physical Exam: BP 133/85   Pulse 90   Temp 98.1 F (36.7 C) (Oral)   Ht 5\' 6"  (1.676 m)   Wt 184 lb 3.2 oz (83.6  kg)   BMI 29.73 kg/m  General:   Alert,  Well-developed, well-nourished, pleasant and cooperative in NAD  Impression:  Recent symptoms of right upper quadrant abdominal pain and bloating seem to be responsive to a different PPI in the way of Dexilant 60 mg daily. To nail this down, I will give the patient Another 3 weeks worth of samples to be taken once daily. She is to call us in 3 weeks to let us not how she is doing. She may not need the appointment at [facility].     Recommendations:  Continue Dexilant 60 mg daily - samples provided  Call in 3 weeks for a progress report. We'll then decide about appointment at [facility]  GERD information provided  OV here in 3 months    Notice: This dictation was prepared with Dragon  dictation along with smaller phrase technology. Any transcriptional errors that result from this process are unintentional and may not be corrected upon review.

## 2016-06-05 ENCOUNTER — Telehealth: Payer: Self-pay | Admitting: Internal Medicine

## 2016-06-05 ENCOUNTER — Encounter: Payer: Self-pay | Admitting: Internal Medicine

## 2016-06-05 ENCOUNTER — Other Ambulatory Visit: Payer: Self-pay

## 2016-06-05 MED ORDER — DEXLANSOPRAZOLE 60 MG PO CPDR: 60.0000 mg | DELAYED_RELEASE_CAPSULE | Freq: Every day | ORAL | 11 refills | Status: DC

## 2016-06-05 NOTE — Telephone Encounter (Signed)
Tried to call pt- Melanie Cochran- told her that I would send her a copay card in the mail. If she prefers something else, she would need to call back and let us know. copay card in the mail to the pt.

## 2016-06-05 NOTE — Telephone Encounter (Signed)
Pt called to say that her dexilant prescription would be over $100. Is there anything else we could recommend that was cheaper. Please advise. [phone]

## 2016-06-05 NOTE — Telephone Encounter (Signed)
Called patient and she does want to cancel her appointment at [facility]. She is feeling better. I will send in her Rx of Dexliant to [facility] in [place]

## 2016-07-16 ENCOUNTER — Encounter: Payer: Self-pay | Admitting: Internal Medicine

## 2016-08-05 ENCOUNTER — Other Ambulatory Visit: Payer: Self-pay | Admitting: Nurse Practitioner

## 2016-08-05 ENCOUNTER — Ambulatory Visit: Payer: Commercial Managed Care - HMO | Admitting: Internal Medicine

## 2016-08-05 DIAGNOSIS — R922 Inconclusive mammogram: Secondary | ICD-10-CM

## 2016-08-05 DIAGNOSIS — Z803 Family history of malignant neoplasm of breast: Secondary | ICD-10-CM

## 2016-08-07 ENCOUNTER — Encounter: Payer: Self-pay | Admitting: Nurse Practitioner

## 2016-08-07 ENCOUNTER — Ambulatory Visit (INDEPENDENT_AMBULATORY_CARE_PROVIDER_SITE_OTHER): Payer: Commercial Managed Care - HMO | Admitting: Nurse Practitioner

## 2016-08-07 VITALS — BP 115/86 | HR 86 | Temp 98.1°F | Ht 66.0 in | Wt 178.6 lb

## 2016-08-07 DIAGNOSIS — R1084 Generalized abdominal pain: Secondary | ICD-10-CM | POA: Diagnosis not present

## 2016-08-07 MED ORDER — DEXLANSOPRAZOLE 60 MG PO CPDR: 60.0000 mg | DELAYED_RELEASE_CAPSULE | Freq: Every day | ORAL | 11 refills | Status: DC

## 2016-08-07 NOTE — Progress Notes (Signed)
Referring Provider: Glenda Chroman, MD Primary Care Physician:  Glenda Chroman, MD Primary GI:  Dr. Gala Romney  Chief Complaint  Patient presents with  . Gastroesophageal Reflux    stopped Dexilant d/t cost  . Abdominal Pain    RUQ, was better while on Dexilant    HPI:   Melanie Cochran is a 41 y.o. female who presents for follow-up on abdominal pain and diarrhea. The patient was last seen in our office 05/13/2016 for the same. At that time it was noted that her gallbladder workup was negative and her symptoms improved when switching from Protonix to [place]. Symptoms returned around the holidays with dietary indiscretion and running out of samples. There is an appointment pending at [facility] for second opinion on January 26. Recommended continue Dexilant 60 mg daily with a progress report in 3 weeks. Follow-up office visit in 3 months. Afterward she decided to cancel her appointment at [facility] in a prescription for Dexilant was sent to her pharmacy.  Today she states she's having symptoms again. Tried to get Dexilant filled but would cost $100. She states she was told we could give her a $20 coupon (copay card?) which woukldn't help much rather than a $20 copay card. Phone note states $20 copay card mailed, patient states she never received it. She went back to OTC Ranitadine and OTC prilosec. Prilosec didn't work as well, so now is on Ranitidine only. Is still on probiotic. She is now on pearls antibiotic. Abdominal pain not as bad as it was, but was better/resolved on Dexilant. Denies hematochezia, melena, unintentional weight loss, fever, chills. Denies chest pain, dyspnea, dizziness, lightheadedness, syncope, near syncope. Denies any other upper or lower GI symptoms.  Past Medical History:  Diagnosis Date  . Hypertension     Past Surgical History:  Procedure Laterality Date  . ABDOMINAL HYSTERECTOMY    . BREAST BIOPSY Right   . CESAREAN SECTION    . ESOPHAGOGASTRODUODENOSCOPY N/A  03/12/2016   Procedure: ESOPHAGOGASTRODUODENOSCOPY (EGD);  Surgeon: Daneil Dolin, MD;  [facility]: [facility];  Service: Endoscopy;  Laterality: N/A;  915  . PARTIAL HYSTERECTOMY    . TUBAL LIGATION      Current Outpatient Prescriptions  Medication Sig Dispense Refill  . acetaminophen (TYLENOL) 500 MG tablet Take 1,000 mg by mouth every 6 (six) hours as needed (for pain.).     Marland Kitchen hydrochlorothiazide (HYDRODIURIL) 25 MG tablet Take 25 mg by mouth daily.     Marland Kitchen ibuprofen (ADVIL,MOTRIN) 200 MG tablet Take 400-600 mg by mouth every 8 (eight) hours as needed (for pain.).    Marland Kitchen Probiotic Product (PROBIOTIC DAILY PO) Take 1 capsule by mouth daily.     . ranitidine (ZANTAC) 150 MG tablet Take 150 mg by mouth as needed for heartburn.     No current facility-administered medications for this visit.     Allergies as of 08/07/2016 - Review Complete 08/07/2016  Allergen Reaction Noted  . Penicillins Other (See Comments) 01/29/2016    Family History  Problem Relation Age of Onset  . Breast cancer Mother   . Hypertension Mother   . Hypertension Father   . Colon cancer Neg Hx     Social History   Social History  . Marital status: Divorced    Spouse name: N/A  . Number of children: N/A  . Years of education: N/A   Social History Main Topics  . Smoking status: Current Every Day Smoker    Packs/day: 0.50  .  Smokeless tobacco: Never Used  . Alcohol use Yes     Comment: socially  . Drug use: No  . Sexual activity: Not Asked   Other Topics Concern  . None   Social History Narrative  . None    Review of Systems: General: Negative for anorexia, weight loss, fever, chills, fatigue, weakness. ENT: Negative for hoarseness, difficulty swallowing. CV: Negative for chest pain, angina, palpitations, peripheral edema.  Respiratory: Negative for dyspnea at rest, cough, sputum, wheezing.  GI: See history of present illness. Endo: Negative for unusual weight change.  Heme: Negative for  bruising or bleeding. Allergy: Negative for rash or hives.   Physical Exam: BP 115/86   Pulse 86   Temp 98.1 F (36.7 C) (Oral)   Ht 5\' 6"  (1.676 m)   Wt 178 lb 9.6 oz (81 kg)   BMI 28.83 kg/m  General:   Alert and oriented. Pleasant and cooperative. Well-nourished and well-developed.  Head:  Normocephalic and atraumatic. Eyes:  Without icterus, sclera clear and conjunctiva pink.  Ears:  Normal auditory acuity. Cardiovascular:  S1, S2 present without murmurs appreciated. Extremities without clubbing or edema. Respiratory:  Clear to auscultation bilaterally. No wheezes, rales, or rhonchi. No distress.  Gastrointestinal:  +BS, soft, non-tender and non-distended. No HSM noted. No guarding or rebound. No masses appreciated.  Rectal:  Deferred  Musculoskalatal:  Symmetrical without gross deformities. Skin:  Intact without significant lesions or rashes. Neurologic:  Alert and oriented x4;  grossly normal neurologically. Psych:  Alert and cooperative. Normal mood and affect. Heme/Lymph/Immune: No excessive bruising noted.    08/07/2016 9:42 AM   Disclaimer: This note was dictated with voice recognition software. Similar sounding words can inadvertently be transcribed and may not be corrected upon review.

## 2016-08-07 NOTE — Assessment & Plan Note (Signed)
Abdominal pain likely due to GERD. At her last visit Dexilant improved her symptoms significantly. However, she could not afford the $100 a month for a prescription. She did not receive a co-pay card. Today I will send her another prescription for Dexilant and provide her with a co-pay card. I have asked her to call us if there is any problems in obtaining the medications that we can try to figure out alternative options. Her abdominal pain is significantly improved, but has come back somewhat on over-the-counter ranitidine. I will have her return for follow-up in 3 months. Call with any problems before then.

## 2016-08-07 NOTE — Patient Instructions (Signed)
1. I have sent an another prescription for Dexilant to your pharmacy. 2. I am providing you with a co-pay card today that should reduce your out of pocket costs significantly. 3. Call us if there is any problems or denials for the medicine at the pharmacy. Alternatively, the pharmacy can call us. 4. Return for follow-up in 3 months. 5. Call if her symptoms become severe or significantly worse before then.

## 2016-08-11 NOTE — Progress Notes (Signed)
CC'D TO PCP °

## 2016-08-14 ENCOUNTER — Ambulatory Visit
Admission: RE | Admit: 2016-08-14 | Discharge: 2016-08-14 | Disposition: A | Discharge status: Home / Self Care | Payer: Commercial Managed Care - HMO | Source: Ambulatory Visit | Attending: Nurse Practitioner | Admitting: Nurse Practitioner

## 2016-08-14 DIAGNOSIS — R922 Inconclusive mammogram: Secondary | ICD-10-CM

## 2016-08-14 DIAGNOSIS — Z803 Family history of malignant neoplasm of breast: Secondary | ICD-10-CM

## 2016-08-14 MED ORDER — GADOBENATE DIMEGLUMINE 529 MG/ML IV SOLN: 18.0000 mL | Freq: Once | INTRAVENOUS | Status: DC | PRN

## 2016-10-30 ENCOUNTER — Ambulatory Visit: Payer: Commercial Managed Care - HMO | Admitting: Nurse Practitioner

## 2017-08-03 ENCOUNTER — Other Ambulatory Visit: Payer: Self-pay | Admitting: Nurse Practitioner

## 2017-08-03 DIAGNOSIS — Z1231 Encounter for screening mammogram for malignant neoplasm of breast: Secondary | ICD-10-CM

## 2017-08-20 ENCOUNTER — Ambulatory Visit
Admission: RE | Admit: 2017-08-20 | Discharge: 2017-08-20 | Disposition: A | Discharge status: Home / Self Care | Payer: 59 | Source: Ambulatory Visit | Attending: Nurse Practitioner | Admitting: Nurse Practitioner

## 2017-08-20 DIAGNOSIS — Z1231 Encounter for screening mammogram for malignant neoplasm of breast: Secondary | ICD-10-CM

## 2017-08-25 ENCOUNTER — Other Ambulatory Visit: Payer: Self-pay | Admitting: Nurse Practitioner

## 2017-08-25 DIAGNOSIS — R928 Other abnormal and inconclusive findings on diagnostic imaging of breast: Secondary | ICD-10-CM

## 2017-08-28 ENCOUNTER — Ambulatory Visit
Admission: RE | Admit: 2017-08-28 | Discharge: 2017-08-28 | Disposition: A | Discharge status: Home / Self Care | Payer: 59 | Source: Ambulatory Visit | Attending: Nurse Practitioner | Admitting: Nurse Practitioner

## 2017-08-28 DIAGNOSIS — R928 Other abnormal and inconclusive findings on diagnostic imaging of breast: Secondary | ICD-10-CM

## 2017-09-25 ENCOUNTER — Encounter: Payer: Self-pay | Admitting: Obstetrics & Gynecology

## 2017-10-23 ENCOUNTER — Other Ambulatory Visit: Payer: Self-pay | Admitting: Obstetrics & Gynecology

## 2017-11-24 ENCOUNTER — Other Ambulatory Visit: Payer: Self-pay | Admitting: Obstetrics & Gynecology

## 2018-02-11 ENCOUNTER — Other Ambulatory Visit: Payer: Self-pay | Admitting: Nurse Practitioner

## 2018-02-11 DIAGNOSIS — N6310 Unspecified lump in the right breast, unspecified quadrant: Secondary | ICD-10-CM

## 2018-02-16 ENCOUNTER — Ambulatory Visit
Admission: RE | Admit: 2018-02-16 | Discharge: 2018-02-16 | Disposition: A | Discharge status: Home / Self Care | Payer: Managed Care, Other (non HMO) | Source: Ambulatory Visit | Attending: Nurse Practitioner | Admitting: Nurse Practitioner

## 2018-02-16 ENCOUNTER — Ambulatory Visit
Admission: RE | Admit: 2018-02-16 | Discharge: 2018-02-16 | Disposition: A | Discharge status: Home / Self Care | Payer: 59 | Source: Ambulatory Visit | Attending: Nurse Practitioner | Admitting: Nurse Practitioner

## 2018-02-16 DIAGNOSIS — N6310 Unspecified lump in the right breast, unspecified quadrant: Secondary | ICD-10-CM

## 2018-03-25 DIAGNOSIS — D241 Benign neoplasm of right breast: Secondary | ICD-10-CM | POA: Insufficient documentation

## 2018-05-12 HISTORY — PX: BREAST EXCISIONAL BIOPSY: SUR124

## 2018-09-09 ENCOUNTER — Other Ambulatory Visit: Payer: Self-pay | Admitting: Nurse Practitioner

## 2018-09-09 DIAGNOSIS — Z1231 Encounter for screening mammogram for malignant neoplasm of breast: Secondary | ICD-10-CM

## 2018-11-01 ENCOUNTER — Ambulatory Visit: Payer: Managed Care, Other (non HMO)

## 2018-12-13 ENCOUNTER — Ambulatory Visit
Admission: RE | Admit: 2018-12-13 | Discharge: 2018-12-13 | Disposition: A | Discharge status: Home / Self Care | Payer: Managed Care, Other (non HMO) | Source: Ambulatory Visit | Attending: Nurse Practitioner | Admitting: Nurse Practitioner

## 2018-12-13 ENCOUNTER — Other Ambulatory Visit: Payer: Self-pay

## 2018-12-13 DIAGNOSIS — Z1231 Encounter for screening mammogram for malignant neoplasm of breast: Secondary | ICD-10-CM

## 2019-06-10 ENCOUNTER — Encounter: Payer: Self-pay | Admitting: Psychiatry

## 2019-06-17 ENCOUNTER — Ambulatory Visit (INDEPENDENT_AMBULATORY_CARE_PROVIDER_SITE_OTHER): Payer: Managed Care, Other (non HMO) | Admitting: Addiction (Substance Use Disorder)

## 2019-06-17 ENCOUNTER — Other Ambulatory Visit: Payer: Self-pay

## 2019-06-17 DIAGNOSIS — F4323 Adjustment disorder with mixed anxiety and depressed mood: Secondary | ICD-10-CM | POA: Diagnosis not present
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
## 2019-06-20 ENCOUNTER — Encounter: Payer: Self-pay | Admitting: Addiction (Substance Use Disorder)

## 2019-06-29 ENCOUNTER — Other Ambulatory Visit: Payer: Self-pay

## 2019-06-29 ENCOUNTER — Ambulatory Visit (INDEPENDENT_AMBULATORY_CARE_PROVIDER_SITE_OTHER): Payer: Managed Care, Other (non HMO) | Admitting: Addiction (Substance Use Disorder)

## 2019-06-29 ENCOUNTER — Encounter: Payer: Self-pay | Admitting: Addiction (Substance Use Disorder)

## 2019-06-29 DIAGNOSIS — F4323 Adjustment disorder with mixed anxiety and depressed mood: Secondary | ICD-10-CM
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
## 2019-07-07 ENCOUNTER — Ambulatory Visit (HOSPITAL_COMMUNITY): Payer: Self-pay | Admitting: Psychiatry

## 2019-07-12 ENCOUNTER — Ambulatory Visit (INDEPENDENT_AMBULATORY_CARE_PROVIDER_SITE_OTHER): Payer: Managed Care, Other (non HMO) | Admitting: Addiction (Substance Use Disorder)

## 2019-07-12 ENCOUNTER — Ambulatory Visit: Payer: Managed Care, Other (non HMO) | Admitting: Adult Health

## 2019-07-12 ENCOUNTER — Encounter: Payer: Self-pay | Admitting: Addiction (Substance Use Disorder)

## 2019-07-12 ENCOUNTER — Other Ambulatory Visit: Payer: Self-pay

## 2019-07-12 DIAGNOSIS — F4310 Post-traumatic stress disorder, unspecified: Secondary | ICD-10-CM | POA: Diagnosis not present

## 2019-07-12 DIAGNOSIS — F4323 Adjustment disorder with mixed anxiety and depressed mood: Secondary | ICD-10-CM
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
## 2019-07-19 ENCOUNTER — Ambulatory Visit: Payer: Managed Care, Other (non HMO) | Admitting: Addiction (Substance Use Disorder)

## 2019-07-20 ENCOUNTER — Encounter: Payer: Self-pay | Admitting: Addiction (Substance Use Disorder)

## 2019-07-20 ENCOUNTER — Other Ambulatory Visit: Payer: Self-pay

## 2019-07-20 ENCOUNTER — Ambulatory Visit (INDEPENDENT_AMBULATORY_CARE_PROVIDER_SITE_OTHER): Payer: Managed Care, Other (non HMO) | Admitting: Addiction (Substance Use Disorder)

## 2019-07-20 DIAGNOSIS — F4323 Adjustment disorder with mixed anxiety and depressed mood: Secondary | ICD-10-CM

## 2019-07-20 NOTE — Progress Notes (Signed)
      [facility] Counselor/Therapist Progress Note  Patient ID: Melanie Cochran, MRN: [id number],    Date: 07/20/2019  Time Spent: 2:02-3:07 103min  Treatment Type: Individual Therapy  Reported Symptoms: sad, grieved  Mental Status Exam:  Appearance:   Casual     Behavior:  Appropriate  Motor:  Normal  Speech/Language:   Pressured  Affect:  Labile and Tearful  Mood:  sad  Thought process:  circumstantial and flight of ideas  Thought content:    Rumination and Tangential  Sensory/Perceptual disturbances:    Flashbacks  Orientation:  x4  Attention:  Good  Concentration:  Good  Memory:  WNL  Fund of knowledge:   Good  Insight:    Good  Judgment:   Good  Impulse Control:  Good and Fair- at times   Risk Assessment: Danger to Self:  No Self-injurious Behavior: No Danger to Others: No Duty to Warn:no Physical Aggression / Violence:No  Access to Firearms a concern: No  Gang Involvement:No   Subjective: Client processed her feelings of sadness related to her mom. Client reported feeling sad and grieved by the loss of her mom again as she goes through menopause and wants her mom's guidance through life events. Therapist used MI and Grief therapy to assist client in processing her pain and grief. Therapist provided support and space for the client to grieve and client made progress sharing, talking, and feeling her feelings instead of "stuffing like she always does". Client processed how this affects her relationship with her kids now who dont appreciate her in their life like she did her mom.   Interventions: Cognitive Behavioral Therapy, Motivational Interviewing and Grief Therapy  Diagnosis:   ICD-10-CM   1. Adjustment disorder with mixed anxiety and depressed mood  F43.23     Plan of Care: Client to return for weekly therapy with Melanie Cochran, therapist, to review again in 6 months. Client to engage in positive self talk and challenging negative internal ruminationsand  self talk causing client to be overly anxious and worriedusing CBT, on daily practice. Client to engage in mindfulness: ie body scans each eveneing to help process and discharge emotional distress & recognize emotions. Client to utilize BSP (brainspotting) with therapist to help client heal past traumas/ attachment issues to make way for healing her need to be needed and regulate her anxiety/reduce depression by decreasing anxietyby 33% in the next 6 months. Client to prioritize sleep 8+ hours each week night AEB going to bed by 10pm each night.  Barnie Del, LCSW, LCAS, CCTP, CCS-I, BSP

## 2019-07-21 ENCOUNTER — Encounter: Payer: Self-pay | Admitting: Adult Health

## 2019-07-21 ENCOUNTER — Ambulatory Visit (INDEPENDENT_AMBULATORY_CARE_PROVIDER_SITE_OTHER): Payer: Managed Care, Other (non HMO) | Admitting: Adult Health

## 2019-07-21 VITALS — BP 166/94 | HR 80 | Ht 66.0 in | Wt 146.0 lb

## 2019-07-21 DIAGNOSIS — F411 Generalized anxiety disorder: Secondary | ICD-10-CM

## 2019-07-21 DIAGNOSIS — G4700 Insomnia, unspecified: Secondary | ICD-10-CM

## 2019-07-21 DIAGNOSIS — F4310 Post-traumatic stress disorder, unspecified: Secondary | ICD-10-CM | POA: Diagnosis not present

## 2019-07-21 DIAGNOSIS — F331 Major depressive disorder, recurrent, moderate: Secondary | ICD-10-CM | POA: Diagnosis not present

## 2019-07-21 MED ORDER — LAMOTRIGINE 25 MG PO TABS: ORAL_TABLET | ORAL | 2 refills | Status: DC

## 2019-07-21 MED ORDER — BUSPIRONE HCL 10 MG PO TABS: 10.0000 mg | ORAL_TABLET | Freq: Three times a day (TID) | ORAL | 2 refills | Status: DC

## 2019-07-21 MED ORDER — CLONAZEPAM 0.5 MG PO TABS: 0.5000 mg | ORAL_TABLET | Freq: Every day | ORAL | 2 refills | Status: DC

## 2019-07-21 NOTE — Progress Notes (Signed)
[facility] MD/PA/NP Initial Note  07/21/2019 4:51 PM Melanie Cochran  MRN:  [id number]  Chief Complaint:   HPI:   Describes mood today as "not the best". Pleasant. Mood symptoms - reports depression and anxiety. Denies irritability. Stating "I have momentts of happiness, the rest of the time I just exist". Stating "I'm not opposed to trying medication". Has tried multiple medication with little success - various side effects. Feels like most of her issues are "hormonal". Has a "don't care attitude" Constantly feels like she is about to "explode". Seeing therapist - Sammuel Cooper. Feels "better" since starting therapy. Stable interest and motivation. Taking medications as prescribed.  Energy levels stable. Active, does not have a regular exercise routine. Works full-time. Director - Business degree.  Enjoys some usual interests and activities. Divorced. Is in a committed relationship - 3 years. Owns her home - daughter and her daughter living with her temporarily. She and her husband working things out - moving out soon. Has 3 cats and dogs. Spending time with family. Appetite adequate. Weight loss - 45 pounds keto x 2 years. Size 16 to size 4.  Sleeping difficulties. Varies night to night. Averages 2 to 6 hours. Focus and concentration stable - "has some days where she gets distracted". Completing tasks. Managing aspects of household. Work going well. Denies SI or HI. Denies AH or VH.  Previous medication trials: Multiple SSRI failures  Visit Diagnosis: No diagnosis found.  Past Psychiatric History: Denies psychiatric hospitalization.  Past Medical History:  Past Medical History:  Diagnosis Date  . Hypertension     Past Surgical History:  Procedure Laterality Date  . ABDOMINAL HYSTERECTOMY    . BREAST BIOPSY Right   . CESAREAN SECTION    . ESOPHAGOGASTRODUODENOSCOPY N/A 03/12/2016   Procedure: ESOPHAGOGASTRODUODENOSCOPY (EGD);  Surgeon: Daneil Dolin, MD;  Location: [facility];   Service: Endoscopy;  Laterality: N/A;  915  . PARTIAL HYSTERECTOMY    . TUBAL LIGATION      Family Psychiatric History: Grandmother schizophrenia.   Family History:  Family History  Problem Relation Age of Onset  . Breast cancer Mother 96  . Hypertension Mother   . Hypertension Father   . Breast cancer Maternal Aunt   . Breast cancer Maternal Grandmother   . Colon cancer Neg Hx     Social History:  Social History   Socioeconomic History  . Marital status: Divorced    Spouse name: Not on file  . Number of children: Not on file  . Years of education: Not on file  . Highest education level: Not on file  Occupational History  . Not on file  Tobacco Use  . Smoking status: Current Every Day Smoker    Packs/day: 0.50  . Smokeless tobacco: Never Used  Substance and Sexual Activity  . Alcohol use: Yes    Comment: socially  . Drug use: No  . Sexual activity: Not on file  Other Topics Concern  . Not on file  Social History Narrative  . Not on file   Social Determinants of Health   Financial Resource Strain:   . Difficulty of Paying Living Expenses:   Food Insecurity:   . Worried About Charity fundraiser in the Last Year:   . Arboriculturist in the Last Year:   Transportation Needs:   . Film/video editor (Medical):   Marland Kitchen Lack of Transportation (Non-Medical):   Physical Activity:   . Days of Exercise per Week:   .  Minutes of Exercise per Session:   Stress:   . Feeling of Stress :   Social Connections:   . Frequency of Communication with Friends and Family:   . Frequency of Social Gatherings with Friends and Family:   . Attends Religious Services:   . Active Member of Clubs or Organizations:   . Attends Archivist Meetings:   Marland Kitchen Marital Status:     Allergies:  Allergies  Allergen Reactions  . Penicillins Other (See Comments)    Childhood reaction Has patient had a PCN reaction causing immediate rash, facial/tongue/throat swelling, SOB or  lightheadedness with hypotension:unsure Has patient had a PCN reaction causing severe rash involving mucus membranes or skin necrosis:unsure Has patient had a PCN reaction that required hospitalization:doesn't think so Has patient had a PCN reaction occurring within the last 10 years:No If all of the above answers are "NO", then may proceed with Cephalosporin use.     Metabolic Disorder Labs: No results found for: HGBA1C, MPG No results found for: PROLACTIN No results found for: CHOL, TRIG, HDL, CHOLHDL, VLDL, LDLCALC No results found for: TSH  Therapeutic Level Labs: No results found for: LITHIUM No results found for: VALPROATE No components found for:  CBMZ  Current Medications: Current Outpatient Medications  Medication Sig Dispense Refill  . acetaminophen (TYLENOL) 500 MG tablet Take 1,000 mg by mouth every 6 (six) hours as needed (for pain.).     Marland Kitchen dexlansoprazole (DEXILANT) 60 MG capsule Take 1 capsule (60 mg total) by mouth daily. 30 capsule 11  . hydrochlorothiazide (HYDRODIURIL) 25 MG tablet Take 25 mg by mouth daily.     Marland Kitchen ibuprofen (ADVIL,MOTRIN) 200 MG tablet Take 400-600 mg by mouth every 8 (eight) hours as needed (for pain.).    Marland Kitchen Probiotic Product (PROBIOTIC DAILY PO) Take 1 capsule by mouth daily.     . ranitidine (ZANTAC) 150 MG tablet Take 150 mg by mouth as needed for heartburn.     No current facility-administered medications for this visit.    Medication Side Effects: none  Orders placed this visit:  No orders of the defined types were placed in this encounter.   Psychiatric Specialty Exam:  Review of Systems  There were no vitals taken for this visit.There is no height or weight on file to calculate BMI.  General Appearance: Neat and Well Groomed  Eye Contact:  Good  Speech:  Clear and Coherent and Normal Rate  Volume:  Normal  Mood:  Anxious and Depressed  Affect:  Appropriate and Congruent  Thought Process:  Coherent and Descriptions of  Associations: Intact  Orientation:  Full (Time, Place, and Person)  Thought Content: Logical   Suicidal Thoughts:  No  Homicidal Thoughts:  No  Memory:  WNL  Judgement:  Good  Insight:  Good  Psychomotor Activity:  Normal  Concentration:  Concentration: Good  Recall:  Good  Fund of Knowledge: Good  Language: Good  Assets:  Communication Skills Desire for Improvement Financial Resources/Insurance Housing Intimacy Leisure Time Physical Health Resilience Social Support Talents/Skills Transportation Vocational/Educational  ADL's:  Intact  Cognition: WNL  Prognosis:  Good   Screenings: None  Receiving Psychotherapy: Yes   Treatment Plan/Recommendations:   Plan:  PDMP reviewed  1. Add Buspar 10mg  TID 2. Add Clonazepam 0.5mg  at hs 3. Add Lamictal 25mg  at bedtime for 14 days, then 50mg  at bedtime.  Continue therapy with Sammuel Cooper  RTC 4 weeks  Patient advised to contact office with any questions, adverse effects, or  acute worsening in signs and symptoms.  Discussed potential benefits, risk, and side effects of benzodiazepines to include potential risk of tolerance and dependence, as well as possible drowsiness.  Advised patient not to drive if experiencing drowsiness and to take lowest possible effective dose to minimize risk of dependence and tolerance.  Counseled patient regarding potential benefits, risks, and side effects of Lamictal to include potential risk of Stevens-Johnson syndrome. Advised patient to stop taking Lamictal and contact office immediately if rash develops and to seek urgent medical attention if rash is severe and/or spreading quickly.   Aloha Gell, NP

## 2019-07-27 ENCOUNTER — Other Ambulatory Visit: Payer: Self-pay

## 2019-07-27 ENCOUNTER — Ambulatory Visit: Payer: Managed Care, Other (non HMO) | Admitting: Adult Health

## 2019-07-27 ENCOUNTER — Ambulatory Visit (INDEPENDENT_AMBULATORY_CARE_PROVIDER_SITE_OTHER): Payer: 59 | Admitting: Addiction (Substance Use Disorder)

## 2019-07-27 DIAGNOSIS — F411 Generalized anxiety disorder: Secondary | ICD-10-CM | POA: Diagnosis not present

## 2019-07-27 DIAGNOSIS — F4310 Post-traumatic stress disorder, unspecified: Secondary | ICD-10-CM | POA: Diagnosis not present

## 2019-07-27 NOTE — Progress Notes (Signed)
      [facility] Counselor/Therapist Progress Note  Patient ID: Melanie Cochran, MRN: [id number],    Date: 07/27/2019  Time Spent: 2:05-2:59 54 mins  Treatment Type: Individual Therapy  Reported Symptoms: guilty, confused about relationships  Mental Status Exam:  Appearance:   Casual     Behavior:  Appropriate  Motor:  Normal  Speech/Language:   Pressured  Affect:  Full Range  Mood:  sad  Thought process:  circumstantial and flight of ideas  Thought content:    Rumination and Tangential  Sensory/Perceptual disturbances:    Flashbacks  Orientation:  x4  Attention:  Good  Concentration:  Good  Memory:  WNL  Fund of knowledge:   Good  Insight:    Good  Judgment:   Good  Impulse Control:  Good and Fair- at times   Risk Assessment: Danger to Self:  No Self-injurious Behavior: No Danger to Others: No Duty to Warn:no Physical Aggression / Violence:No  Access to Firearms a concern: No  Gang Involvement:No   Subjective: Client processed some relationship complications and some feelings of guilt. Therapist used CBT to help client dig into her distorted thoughts around not being worthy/important to others. Therapist used MI to validate and reflect to client and ask open ended questions to help client explore her core beliefs and roots further. Client found clarity as she processed her pain related to not feeling "like enough" and worthy. Client shared how she waited for a man to make her first in their life, and now when a guy wants to put her first/sacrifices everything for her and she doesn't feel worthy to . Client made progress identifying how she controls all relationships to keep men at a arm's distance to keep herself safe/ from getting hurt. Therapist used grief therapy to offer support for the client as she became tearful and grieved when she first felt unworthy first with childhood trauma.   Interventions: Cognitive Behavioral Therapy, Motivational Interviewing and Grief  Therapy  Diagnosis:   ICD-10-CM   1. PTSD (post-traumatic stress disorder)  F43.10   2. Generalized anxiety disorder  F41.1     Plan of Care: Client to return for weekly therapy with Sammuel Cooper, therapist, to review again in 6 months. Client to engage in positive self talk and challenging negative internal ruminationsand self talk causing client to be overly anxious and worriedusing CBT, on daily practice. Client to engage in mindfulness: ie body scans each eveneing to help process and discharge emotional distress & recognize emotions. Client to utilize BSP (brainspotting) with therapist to help client heal past traumas/ attachment issues to make way for healing her need to be needed and regulate her anxiety/reduce depression by decreasing anxietyby 33% in the next 6 months. Client to prioritize sleep 8+ hours each week night AEB going to bed by 10pm each night.  Barnie Del, LCSW, LCAS, CCTP, CCS-I, BSP

## 2019-08-03 ENCOUNTER — Other Ambulatory Visit: Payer: Self-pay

## 2019-08-03 ENCOUNTER — Encounter: Payer: Self-pay | Admitting: Addiction (Substance Use Disorder)

## 2019-08-03 ENCOUNTER — Ambulatory Visit (INDEPENDENT_AMBULATORY_CARE_PROVIDER_SITE_OTHER): Payer: 59 | Admitting: Addiction (Substance Use Disorder)

## 2019-08-03 DIAGNOSIS — F331 Major depressive disorder, recurrent, moderate: Secondary | ICD-10-CM | POA: Diagnosis not present

## 2019-08-03 DIAGNOSIS — F4310 Post-traumatic stress disorder, unspecified: Secondary | ICD-10-CM
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
## 2019-08-11 ENCOUNTER — Other Ambulatory Visit: Payer: Self-pay

## 2019-08-11 ENCOUNTER — Ambulatory Visit: Payer: Managed Care, Other (non HMO) | Admitting: Adult Health

## 2019-08-11 ENCOUNTER — Encounter: Payer: Self-pay | Admitting: Adult Health

## 2019-08-11 ENCOUNTER — Ambulatory Visit (INDEPENDENT_AMBULATORY_CARE_PROVIDER_SITE_OTHER): Payer: 59 | Admitting: Addiction (Substance Use Disorder)

## 2019-08-11 DIAGNOSIS — G4700 Insomnia, unspecified: Secondary | ICD-10-CM

## 2019-08-11 DIAGNOSIS — F411 Generalized anxiety disorder: Secondary | ICD-10-CM | POA: Diagnosis not present

## 2019-08-11 DIAGNOSIS — F4323 Adjustment disorder with mixed anxiety and depressed mood: Secondary | ICD-10-CM | POA: Diagnosis not present

## 2019-08-11 DIAGNOSIS — F331 Major depressive disorder, recurrent, moderate: Secondary | ICD-10-CM

## 2019-08-11 DIAGNOSIS — F4310 Post-traumatic stress disorder, unspecified: Secondary | ICD-10-CM

## 2019-08-11 MED ORDER — BUPROPION HCL ER (XL) 150 MG PO TB24: ORAL_TABLET | ORAL | 2 refills | Status: DC

## 2019-08-11 NOTE — Progress Notes (Signed)
Alvie Nehmer [facility] [id number] Oct 27, 1975 44 y.o.  Subjective:   Patient ID:  Melanie Cochran is a 44 y.o. (DOB 05/02/1976) female.  Chief Complaint: No chief complaint on file.   HPI   Melanie Cochran presents to the office today for follow-up of MDD, GAD, insomnia, and PTSD.   Describes mood today as "ok". Pleasant. Mood symptoms - reports depression and anxiety and irritability. Stating "I think the Lamictal was helping some, but "broke out with a rash" after the 3rd day of increasing the dose to 50mg . Saw PCP and medication was stopped. Started on Prednisone. Uncertain if rash related to Lamictal. Recent starts on other medications.bFeels like mood has improved overall. Not as "down in the dumps" like she was. Not as tearful. Feels like "talk therapy" has been helpful. Is willing to try other medication.  Varying interest and motivation. Taking medications as prescribed.   Energy levels lower. Active, does not have a regular exercise routine. Works full-time.  Enjoys some usual interests and activities. Divorced. Is in a committed relationship - 3 years. Owns her home - daughter and her daughter living with her. Has 3 cats and dogs. Spending time with family. Appetite adequate. Weight gain - 5 pounds. Sleeping better some nights than others. Averages 5 to 6 hours. Focus and concentration stable "ok", "some days are better than others". Completing tasks. Managing aspects of household. Work going well. Denies SI or HI. Denies AH or VH.  Previous medication trials: Multiple SSRI failures.   Review of Systems:  Review of Systems  Musculoskeletal: Negative for gait problem.  Neurological: Negative for tremors.  Psychiatric/Behavioral:       Please refer to HPI    Medications: I have reviewed the patient's current medications.  Current Outpatient Medications  Medication Sig Dispense Refill  . acetaminophen (TYLENOL) 500 MG tablet Take 1,000 mg by mouth every 6 (six) hours as needed (for  pain.).     Marland Kitchen buPROPion (WELLBUTRIN XL) 150 MG 24 hr tablet Take one tablet in the morning for 7 days, then take two tablets daily. 60 tablet 2  . busPIRone (BUSPAR) 10 MG tablet Take 1 tablet (10 mg total) by mouth 3 (three) times daily. 90 tablet 2  . clonazePAM (KLONOPIN) 0.5 MG tablet Take 1 tablet (0.5 mg total) by mouth at bedtime. 30 tablet 2  . dexlansoprazole (DEXILANT) 60 MG capsule Take 1 capsule (60 mg total) by mouth daily. 30 capsule 11  . diazepam (VALIUM) 5 MG tablet     . hydrochlorothiazide (HYDRODIURIL) 25 MG tablet Take 25 mg by mouth daily.     Marland Kitchen ibuprofen (ADVIL,MOTRIN) 200 MG tablet Take 400-600 mg by mouth every 8 (eight) hours as needed (for pain.).    Marland Kitchen meloxicam (MOBIC) 15 MG tablet Take 15 mg by mouth daily.    . predniSONE (STERAPRED UNI-PAK 21 TAB) 5 MG (21) TBPK tablet     . Probiotic Product (PROBIOTIC DAILY PO) Take 1 capsule by mouth daily.     . ranitidine (ZANTAC) 150 MG tablet Take 150 mg by mouth as needed for heartburn.     No current facility-administered medications for this visit.    Medication Side Effects: None  Allergies:  Allergies  Allergen Reactions  . Penicillins Other (See Comments)    Childhood reaction Has patient had a PCN reaction causing immediate rash, facial/tongue/throat swelling, SOB or lightheadedness with hypotension:unsure Has patient had a PCN reaction causing severe rash involving mucus membranes or skin necrosis:unsure Has patient  had a PCN reaction that required hospitalization:doesn't think so Has patient had a PCN reaction occurring within the last 10 years:No If all of the above answers are "NO", then may proceed with Cephalosporin use.   . Lamictal [Lamotrigine] Rash    Past Medical History:  Diagnosis Date  . Hypertension     Family History  Problem Relation Age of Onset  . Breast cancer Mother 21  . Hypertension Mother   . Hypertension Father   . Breast cancer Maternal Aunt   . Breast cancer Maternal  Grandmother   . Colon cancer Neg Hx     Social History   Socioeconomic History  . Marital status: Divorced    Spouse name: Not on file  . Number of children: Not on file  . Years of education: Not on file  . Highest education level: Not on file  Occupational History  . Not on file  Tobacco Use  . Smoking status: Current Every Day Smoker    Packs/day: 0.50  . Smokeless tobacco: Never Used  Substance and Sexual Activity  . Alcohol use: Yes    Comment: socially  . Drug use: No  . Sexual activity: Not on file  Other Topics Concern  . Not on file  Social History Narrative  . Not on file   Social Determinants of Health   Financial Resource Strain:   . Difficulty of Paying Living Expenses:   Food Insecurity:   . Worried About Charity fundraiser in the Last Year:   . Arboriculturist in the Last Year:   Transportation Needs:   . Film/video editor (Medical):   Marland Kitchen Lack of Transportation (Non-Medical):   Physical Activity:   . Days of Exercise per Week:   . Minutes of Exercise per Session:   Stress:   . Feeling of Stress :   Social Connections:   . Frequency of Communication with Friends and Family:   . Frequency of Social Gatherings with Friends and Family:   . Attends Religious Services:   . Active Member of Clubs or Organizations:   . Attends Archivist Meetings:   Marland Kitchen Marital Status:   Intimate Partner Violence:   . Fear of Current or Ex-Partner:   . Emotionally Abused:   Marland Kitchen Physically Abused:   . Sexually Abused:     Past Medical History, Surgical history, Social history, and Family history were reviewed and updated as appropriate.   Please see review of systems for further details on the patient's review from today.   Objective:   Physical Exam:  There were no vitals taken for this visit.  Physical Exam Constitutional:      General: She is not in acute distress. Musculoskeletal:        General: No deformity.  Neurological:     Mental  Status: She is alert and oriented to person, place, and time.     Coordination: Coordination normal.  Psychiatric:        Attention and Perception: Attention and perception normal. She does not perceive auditory or visual hallucinations.        Mood and Affect: Mood is depressed. Mood is not anxious. Affect is not labile, blunt, angry or inappropriate.        Speech: Speech normal.        Behavior: Behavior normal.        Thought Content: Thought content normal. Thought content is not paranoid or delusional. Thought content does not include homicidal or  suicidal ideation. Thought content does not include homicidal or suicidal plan.        Cognition and Memory: Cognition and memory normal.        Judgment: Judgment normal.     Comments: Insight intact     Lab Review:     Component Value Date/Time   PROT 7.1 01/29/2016 1038   ALBUMIN 4.2 01/29/2016 1038   AST 14 01/29/2016 1038   ALT 14 01/29/2016 1038   ALKPHOS 91 01/29/2016 1038   BILITOT 0.5 01/29/2016 1038       Component Value Date/Time   WBC 6.9 01/29/2016 1038   RBC 4.33 01/29/2016 1038   HGB 12.9 01/29/2016 1038   HCT 38.0 01/29/2016 1038   PLT 298 01/29/2016 1038   MCV 87.8 01/29/2016 1038   MCH 29.8 01/29/2016 1038   MCHC 33.9 01/29/2016 1038   RDW 13.1 01/29/2016 1038   LYMPHSABS 1,725 01/29/2016 1038   MONOABS 414 01/29/2016 1038   EOSABS 69 01/29/2016 1038   BASOSABS 0 01/29/2016 1038    No results found for: POCLITH, LITHIUM   No results found for: PHENYTOIN, PHENOBARB, VALPROATE, CBMZ   .res Assessment: Plan:    Plan:  PDMP reviewed  1. Buspar 10mg  TID - taken one half dose once 2. Clonazepam 0.5mg  at hs 3. D/C  Lamictal - rash 4. Add Wellbutrin XL 150mg  x 7, then increase to 300mg .   Consider Prozac   Continue therapy with Sammuel Cooper  RTC 4 weeks  Patient advised to contact office with any questions, adverse effects, or acute worsening in signs and symptoms.  Discussed potential  benefits, risk, and side effects of benzodiazepines to include potential risk of tolerance and dependence, as well as possible drowsiness.  Advised patient not to drive if experiencing drowsiness and to take lowest possible effective dose to minimize risk of dependence and tolerance.  Diagnoses and all orders for this visit:  Major depressive disorder, recurrent episode, moderate ([facility]) -     buPROPion (WELLBUTRIN XL) 150 MG 24 hr tablet; Take one tablet in the morning for 7 days, then take two tablets daily.  PTSD (post-traumatic stress disorder)  Generalized anxiety disorder -     buPROPion (WELLBUTRIN XL) 150 MG 24 hr tablet; Take one tablet in the morning for 7 days, then take two tablets daily.  Insomnia, unspecified type     Please see After Visit Summary for patient specific instructions.  Future Appointments  Date Time Provider [place]  09/01/2019  1:00 PM Barnie Del, [place] CP-CP None  09/01/2019  2:40 PM [place], Berdie Ogren, NP CP-CP None  09/15/2019  3:00 PM Barnie Del, LCSW CP-CP None  09/28/2019  3:00 PM Barnie Del, LCSW CP-CP None    No orders of the defined types were placed in this encounter.   -------------------------------

## 2019-08-11 NOTE — Progress Notes (Signed)
      [facility] Counselor/Therapist Progress Note  Patient ID: Melanie Cochran, MRN: [id number],    Date: 08/11/2019  Time Spent: 3:07-4:05 58 mins  Treatment Type: Individual Therapy  Reported Symptoms: emotional, vulnerable, scared.   Mental Status Exam:  Appearance:   Neat and Well Groomed     Behavior:  Appropriate  Motor:  Normal  Speech/Language:   Clear and Coherent  Affect:  Labile, Full Range and Tearful  Mood:  depressed and sad  Thought process:  normal  Thought content:    Rumination  Sensory/Perceptual disturbances:    Flashbacks  Orientation:  x4  Attention:  Good  Concentration:  Good  Memory:  WNL  Fund of knowledge:   Good  Insight:    Good  Judgment:   Good  Impulse Control:  Good and Fair- at times   Risk Assessment: Danger to Self:  No Self-injurious Behavior: No Danger to Others: No Duty to Warn:no Physical Aggression / Violence:No  Access to Firearms a concern: No  Gang Involvement:No   Subjective: Client shared her feelings of being exposed and vulnerable in her relationship. Client identified her fears linked to old wounds and therapist used MI and BSP to help normalize client's fear and help provide support as she processed the pain to decrease emotional activiation (SUDs reduced from 7/10 to a 1/10 in session) as therapist used BSP and mindfulness to help client ground and process stuck memories in her brain/body. Client made progress in sesion and expressed plan to invite bf in to share her pain and ask him to receive her into his life/home/etc. Therapist used Grief therapy and provided empathy as she remembered the painful experiences that caused her wounds and she grieved.   Interventions: Cognitive Behavioral Therapy, Motivational Interviewing, Grief Therapy and Brainspotting  Diagnosis:   ICD-10-CM   1. PTSD (post-traumatic stress disorder)  F43.10   2. Adjustment disorder with mixed anxiety and depressed mood  F43.23     Plan of  Care: Client to return for weekly therapy with Sammuel Cooper, therapist, to review again in 6 months. Client to engage in positive self talk and challenging negative internal ruminationsand self talk causing client to be overly anxious and worriedusing CBT, on daily practice. Client to engage in mindfulness: ie body scans each eveneing to help process and discharge emotional distress & recognize emotions. Client to utilize BSP (brainspotting) with therapist to help client heal past traumas/ attachment issues to make way for: healing her need to be needed, regulate her anxiety/reduce depression by decreasing anxietyby 33% in the next 6 months, and help her attachment complications in romantic relationships  Client to prioritize sleep 8+ hours each week night AEB going to bed by 10pm each night.  Barnie Del, LCSW, LCAS, CCTP, CCS-I, BSP

## 2019-08-13 ENCOUNTER — Other Ambulatory Visit: Payer: Self-pay | Admitting: Adult Health

## 2019-08-13 DIAGNOSIS — F411 Generalized anxiety disorder: Secondary | ICD-10-CM

## 2019-09-01 ENCOUNTER — Ambulatory Visit: Payer: 59 | Admitting: Adult Health

## 2019-09-01 ENCOUNTER — Other Ambulatory Visit: Payer: Self-pay

## 2019-09-01 ENCOUNTER — Encounter: Payer: Self-pay | Admitting: Addiction (Substance Use Disorder)

## 2019-09-01 ENCOUNTER — Ambulatory Visit (INDEPENDENT_AMBULATORY_CARE_PROVIDER_SITE_OTHER): Payer: 59 | Admitting: Addiction (Substance Use Disorder)

## 2019-09-01 ENCOUNTER — Encounter: Payer: Self-pay | Admitting: Adult Health

## 2019-09-01 DIAGNOSIS — F331 Major depressive disorder, recurrent, moderate: Secondary | ICD-10-CM | POA: Diagnosis not present

## 2019-09-01 DIAGNOSIS — F4323 Adjustment disorder with mixed anxiety and depressed mood: Secondary | ICD-10-CM | POA: Diagnosis not present

## 2019-09-01 DIAGNOSIS — F4310 Post-traumatic stress disorder, unspecified: Secondary | ICD-10-CM | POA: Diagnosis not present

## 2019-09-01 DIAGNOSIS — F411 Generalized anxiety disorder: Secondary | ICD-10-CM

## 2019-09-01 DIAGNOSIS — G4700 Insomnia, unspecified: Secondary | ICD-10-CM | POA: Diagnosis not present

## 2019-09-01 NOTE — Progress Notes (Signed)
      [facility] Counselor/Therapist Progress Note  Patient ID: Melanie Cochran, MRN: [id number],    Date: 09/01/2019  Time Spent: 1:04- 2:00 56 mins  Treatment Type: Individual Therapy  Reported Symptoms: unsure, anxious, stressed about relationships.   Mental Status Exam:  Appearance:   Neat     Behavior:  Appropriate  Motor:  Normal  Speech/Language:   Clear and Coherent  Affect:  Labile, Full Range and Tearful  Mood:  anxious  Thought process:  normal  Thought content:    Rumination  Sensory/Perceptual disturbances:    Flashbacks  Orientation:  x4  Attention:  Good  Concentration:  Good  Memory:  WNL  Fund of knowledge:   Good  Insight:    Good  Judgment:   Good  Impulse Control:  Good and Fair- at times   Risk Assessment: Danger to Self:  No Self-injurious Behavior: No Danger to Others: No Duty to Warn:no Physical Aggression / Violence:No  Access to Firearms a concern: No  Gang Involvement:No   Subjective: Client shared her symptoms of: anxiety and feeling unsure. Client shared how unsure she feels about her own self-esteem, worthlessness, and lack of surity about her relationship. Therapist used MI and Grief therapy to validate client's experience and to help her process old wounds that continue to cause her present distress. Client's bf [name] shared that she thinks that hes pulling away from making next relationship steps, but he thinks she isnt ready to commit for the long-haul. Therapist asked client what makes her feel loved and okay being vulnerable with her bf [name]. Client identified a need for unconditional love and a desire to be wanted by her bf. Therapist helped client identify her wound connected to feeling wanted and client made progress. Client shared a negative core belief: I'm not enough. And therapist used brainspotting to help client continue to process body and eye spot activations (of 5/10 SUDs) connected to that negative core belief. Client made  progress sitting with her disstress in her gut and offering herself compassion, until she found relief/ release from the nauseas gut.   Interventions: Cognitive Behavioral Therapy, Motivational Interviewing, Grief Therapy and Brainspotting  Diagnosis:   ICD-10-CM   1. Adjustment disorder with mixed anxiety and depressed mood  F43.23   2. Generalized anxiety disorder  F41.1     Plan of Care: Client to return for weekly therapy with Sammuel Cooper, therapist, to review again in 6 months. Client to engage in positive self talk and challenging negative internal ruminationsand self talk causing client to be overly anxious and worriedusing CBT, on daily practice. Client to engage in mindfulness: ie body scans each eveneing to help process and discharge emotional distress & recognize emotions. Client to utilize BSP (brainspotting) with therapist to help client heal past traumas/ attachment issues to make way for: healing her need to be needed, regulate her anxiety/reduce depression by decreasing anxietyby 33% in the next 6 months, and help her attachment complications in romantic relationships  Client to prioritize sleep 8+ hours each week night AEB going to bed by 10pm each night.  Barnie Del, LCSW, LCAS, CCTP, CCS-I, BSP

## 2019-09-01 NOTE — Progress Notes (Signed)
Melanie Cochran [facility] [id number] 03/14/1976 44 y.o.  Subjective:   Patient ID:  Melanie Cochran is a 44 y.o. (DOB 01/09/1976) female.  Chief Complaint: No chief complaint on file.   HPI Melanie Cochran presents to the office today for follow-up of MDD, GAD, insomnia, and PTSD.   Describes mood today as "ok". Pleasant. Tearful "sometimes". Mood symptoms - reports depression, anxiety and irritability - "people say I'm more snippy". Feels ing "down in the dumps, but not as bad". Started Wellbutrin 4 days ago and is not sure how it's working "yet". Wanted to wait until the Lamictal rash was gone to start it. Tolerating "so far" and plans to continue. Taking steroids and an antibiotic for a sinus infection. Recent beach trip with the "girls".Varying interest and motivation. Taking medications as prescribed.   Energy levels lower recently with infection. Active, does not have a regular exercise routine. Works full-time.  Enjoys some usual interests and activities. Divorced. Is in a committed relationship - 3 years. Owns her home - daughter and her daughter living with her. Has 4 cats and 4 dogs. Spending time with family. Appetite adequate. Weight gain - 8 pounds - started back on Keto diet. Sleeping better some nights than others - "it's fair". Averages 5 to 6 hours. Dreaming more. Focus and concentration stable. Completing tasks. Managing aspects of household. Work going well. Denies SI or HI. Denies AH or VH.  Previous medication trials: Multiple SSRI failures, Lamictal - rash  Review of Systems:  Review of Systems  Musculoskeletal: Negative for gait problem.  Neurological: Negative for tremors.  Psychiatric/Behavioral:       Please refer to HPI    Medications: I have reviewed the patient's current medications.  Current Outpatient Medications  Medication Sig Dispense Refill  . acetaminophen (TYLENOL) 500 MG tablet Take 1,000 mg by mouth every 6 (six) hours as needed (for pain.).     Marland Kitchen  buPROPion (WELLBUTRIN XL) 150 MG 24 hr tablet Take one tablet in the morning for 7 days, then take two tablets daily. 60 tablet 2  . busPIRone (BUSPAR) 10 MG tablet TAKE 1 TABLET BY MOUTH THREE TIMES A DAY 270 tablet 1  . clonazePAM (KLONOPIN) 0.5 MG tablet Take 1 tablet (0.5 mg total) by mouth at bedtime. 30 tablet 2  . dexlansoprazole (DEXILANT) 60 MG capsule Take 1 capsule (60 mg total) by mouth daily. 30 capsule 11  . diazepam (VALIUM) 5 MG tablet     . hydrochlorothiazide (HYDRODIURIL) 25 MG tablet Take 25 mg by mouth daily.     Marland Kitchen ibuprofen (ADVIL,MOTRIN) 200 MG tablet Take 400-600 mg by mouth every 8 (eight) hours as needed (for pain.).    Marland Kitchen meloxicam (MOBIC) 15 MG tablet Take 15 mg by mouth daily.    . predniSONE (STERAPRED UNI-PAK 21 TAB) 5 MG (21) TBPK tablet     . Probiotic Product (PROBIOTIC DAILY PO) Take 1 capsule by mouth daily.     . ranitidine (ZANTAC) 150 MG tablet Take 150 mg by mouth as needed for heartburn.     No current facility-administered medications for this visit.    Medication Side Effects: None  Allergies:  Allergies  Allergen Reactions  . Penicillins Other (See Comments)    Childhood reaction Has patient had a PCN reaction causing immediate rash, facial/tongue/throat swelling, SOB or lightheadedness with hypotension:unsure Has patient had a PCN reaction causing severe rash involving mucus membranes or skin necrosis:unsure Has patient had a PCN reaction that required hospitalization:doesn't  think so Has patient had a PCN reaction occurring within the last 10 years:No If all of the above answers are "NO", then may proceed with Cephalosporin use.   . Lamictal [Lamotrigine] Rash    Past Medical History:  Diagnosis Date  . Hypertension     Family History  Problem Relation Age of Onset  . Breast cancer Mother 32  . Hypertension Mother   . Hypertension Father   . Breast cancer Maternal Aunt   . Breast cancer Maternal Grandmother   . Colon cancer Neg  Hx     Social History   Socioeconomic History  . Marital status: Divorced    Spouse name: Not on file  . Number of children: Not on file  . Years of education: Not on file  . Highest education level: Not on file  Occupational History  . Not on file  Tobacco Use  . Smoking status: Current Every Day Smoker    Packs/day: 0.50  . Smokeless tobacco: Never Used  Substance and Sexual Activity  . Alcohol use: Yes    Comment: socially  . Drug use: No  . Sexual activity: Not on file  Other Topics Concern  . Not on file  Social History Narrative  . Not on file   Social Determinants of Health   Financial Resource Strain:   . Difficulty of Paying Living Expenses:   Food Insecurity:   . Worried About Charity fundraiser in the Last Year:   . Arboriculturist in the Last Year:   Transportation Needs:   . Film/video editor (Medical):   Marland Kitchen Lack of Transportation (Non-Medical):   Physical Activity:   . Days of Exercise per Week:   . Minutes of Exercise per Session:   Stress:   . Feeling of Stress :   Social Connections:   . Frequency of Communication with Friends and Family:   . Frequency of Social Gatherings with Friends and Family:   . Attends Religious Services:   . Active Member of Clubs or Organizations:   . Attends Archivist Meetings:   Marland Kitchen Marital Status:   Intimate Partner Violence:   . Fear of Current or Ex-Partner:   . Emotionally Abused:   Marland Kitchen Physically Abused:   . Sexually Abused:     Past Medical History, Surgical history, Social history, and Family history were reviewed and updated as appropriate.   Please see review of systems for further details on the patient's review from today.   Objective:   Physical Exam:  There were no vitals taken for this visit.  Physical Exam Constitutional:      General: She is not in acute distress. Musculoskeletal:        General: No deformity.  Neurological:     Mental Status: She is alert and oriented to  person, place, and time.     Cranial Nerves: No dysarthria.     Coordination: Coordination normal.  Psychiatric:        Attention and Perception: Attention and perception normal. She does not perceive auditory or visual hallucinations.        Mood and Affect: Mood normal. Mood is not anxious or depressed. Affect is not labile, blunt, angry or inappropriate.        Speech: Speech normal.        Behavior: Behavior normal. Behavior is cooperative.        Thought Content: Thought content normal. Thought content is not paranoid or delusional. Thought content  does not include homicidal or suicidal ideation. Thought content does not include homicidal or suicidal plan.        Cognition and Memory: Cognition and memory normal.        Judgment: Judgment normal.     Comments: Insight intact     Lab Review:     Component Value Date/Time   PROT 7.1 01/29/2016 1038   ALBUMIN 4.2 01/29/2016 1038   AST 14 01/29/2016 1038   ALT 14 01/29/2016 1038   ALKPHOS 91 01/29/2016 1038   BILITOT 0.5 01/29/2016 1038       Component Value Date/Time   WBC 6.9 01/29/2016 1038   RBC 4.33 01/29/2016 1038   HGB 12.9 01/29/2016 1038   HCT 38.0 01/29/2016 1038   PLT 298 01/29/2016 1038   MCV 87.8 01/29/2016 1038   MCH 29.8 01/29/2016 1038   MCHC 33.9 01/29/2016 1038   RDW 13.1 01/29/2016 1038   LYMPHSABS 1,725 01/29/2016 1038   MONOABS 414 01/29/2016 1038   EOSABS 69 01/29/2016 1038   BASOSABS 0 01/29/2016 1038    No results found for: POCLITH, LITHIUM   No results found for: PHENYTOIN, PHENOBARB, VALPROATE, CBMZ   .res Assessment: Plan:    Plan:  PDMP reviewed  1. Buspar 10mg  TID - taken one half dose once 2. Clonazepam 0.5mg  at hs 3. Add Wellbutrin XL 150mg  x 7, then increase to 300mg .   Consider Prozac   Continue therapy with Sammuel Cooper  RTC 4 weeks  Patient advised to contact office with any questions, adverse effects, or acute worsening in signs and symptoms.  Discussed potential  benefits, risk, and side effects of benzodiazepines to include potential risk of tolerance and dependence, as well as possible drowsiness.  Advised patient not to drive if experiencing drowsiness and to take lowest possible effective dose to minimize risk of dependence and tolerance.   Diagnoses and all orders for this visit:  Insomnia, unspecified type  PTSD (post-traumatic stress disorder)  Major depressive disorder, recurrent episode, moderate ([place])  Generalized anxiety disorder     Please see After Visit Summary for patient specific instructions.  Future Appointments  Date Time Provider [place]  09/15/2019  3:00 PM Barnie Del, LCSW [facility] None  09/28/2019  3:00 PM Barnie Del, LCSW [facility] None  10/17/2019  2:00 PM Barnie Del, LCSW [facility] None  10/31/2019  2:00 PM Barnie Del, LCSW [facility] None    No orders of the defined types were placed in this encounter.   -------------------------------

## 2019-09-15 ENCOUNTER — Other Ambulatory Visit: Payer: Self-pay | Admitting: Adult Health

## 2019-09-15 ENCOUNTER — Other Ambulatory Visit: Payer: Self-pay

## 2019-09-15 ENCOUNTER — Ambulatory Visit (INDEPENDENT_AMBULATORY_CARE_PROVIDER_SITE_OTHER): Payer: 59 | Admitting: Addiction (Substance Use Disorder)

## 2019-09-15 ENCOUNTER — Encounter: Payer: Self-pay | Admitting: Addiction (Substance Use Disorder)

## 2019-09-15 DIAGNOSIS — F411 Generalized anxiety disorder: Secondary | ICD-10-CM

## 2019-09-15 DIAGNOSIS — F4323 Adjustment disorder with mixed anxiety and depressed mood: Secondary | ICD-10-CM | POA: Diagnosis not present

## 2019-09-15 DIAGNOSIS — F331 Major depressive disorder, recurrent, moderate: Secondary | ICD-10-CM

## 2019-09-15 NOTE — Progress Notes (Signed)
      [facility] Counselor/Therapist Progress Note  Patient ID: Melanie Cochran, MRN: [id number],    Date: 09/15/2019  Time Spent: 3:10-4:15 65 mins  Treatment Type: Individual Therapy  Reported Symptoms: depression, relationship problems.   Mental Status Exam:  Appearance:   Well Groomed     Behavior:  Appropriate  Motor:  Normal  Speech/Language:   Clear and Coherent  Affect:  Labile, Full Range and Tearful  Mood:  anxious and depressed  Thought process:  normal  Thought content:    Rumination  Sensory/Perceptual disturbances:    Flashbacks  Orientation:  x4  Attention:  Good  Concentration:  Good  Memory:  WNL  Fund of knowledge:   Good  Insight:    Good  Judgment:   Good  Impulse Control:  Good and Fair- at times   Risk Assessment: Danger to Self:  No Self-injurious Behavior: No Danger to Others: No Duty to Warn:no Physical Aggression / Violence:No  Access to Firearms a concern: No  Gang Involvement:No   Subjective: Client processed her interaction with her PCP describing her mood swings and "spiraling" over the last 6 months, recognizing its because she pours out to everyone else. Client processed her need to take care of herself and ask for what she needs. Thearpist used MI and CBT with client to help validate her need to care for herself and help her identify what she needs to ask for that shes thinking. Client identified areas she needs to get her needs met including with her daughter who lives with her and with her boyfriend. Client processed high expectations and let downs when others dont live with the same values or hold themselves to the same standard. Client processed areas in her relationship that shes working on communication and is hopeful he is hearing her needs. Therapist also used family systems to help encourage client in identifying areas of growth in her family dynamics to improve the relationship with her daughter, living at her home. Client agreed and  reported a desire to invite her into her therapy one day in the near future.   Interventions: Cognitive Behavioral Therapy, Motivational Interviewing and Family Systems  Diagnosis:   ICD-10-CM   1. Adjustment disorder with mixed anxiety and depressed mood  F43.23     Plan of Care: Client to return for weekly therapy with Sammuel Cooper, therapist, to review again in 6 months. Client to engage in positive self talk and challenging negative internal ruminationsand self talk causing client to be overly anxious and worriedusing CBT, on daily practice. Client to engage in mindfulness: ie body scans each eveneing to help process and discharge emotional distress & recognize emotions. Client to utilize BSP (brainspotting) with therapist to help client heal past traumas/ attachment issues to make way for: healing her need to be needed, regulate her anxiety/reduce depression by decreasing anxietyby 33% in the next 6 months, and help her attachment complications in romantic relationships  Client to prioritize sleep 8+ hours each week night AEB going to bed by 10pm each night.  Barnie Del, LCSW, LCAS, CCTP, CCS-I, BSP

## 2019-09-28 ENCOUNTER — Encounter: Payer: Self-pay | Admitting: Addiction (Substance Use Disorder)

## 2019-09-28 ENCOUNTER — Other Ambulatory Visit: Payer: Self-pay

## 2019-09-28 ENCOUNTER — Ambulatory Visit (INDEPENDENT_AMBULATORY_CARE_PROVIDER_SITE_OTHER): Payer: 59 | Admitting: Adult Health

## 2019-09-28 ENCOUNTER — Encounter: Payer: Self-pay | Admitting: Adult Health

## 2019-09-28 ENCOUNTER — Ambulatory Visit: Payer: 59 | Admitting: Addiction (Substance Use Disorder)

## 2019-09-28 DIAGNOSIS — F411 Generalized anxiety disorder: Secondary | ICD-10-CM | POA: Diagnosis not present

## 2019-09-28 DIAGNOSIS — F331 Major depressive disorder, recurrent, moderate: Secondary | ICD-10-CM | POA: Diagnosis not present

## 2019-09-28 DIAGNOSIS — G4700 Insomnia, unspecified: Secondary | ICD-10-CM

## 2019-09-28 DIAGNOSIS — F4310 Post-traumatic stress disorder, unspecified: Secondary | ICD-10-CM

## 2019-09-28 DIAGNOSIS — F4323 Adjustment disorder with mixed anxiety and depressed mood: Secondary | ICD-10-CM

## 2019-09-28 MED ORDER — BUPROPION HCL ER (XL) 300 MG PO TB24: ORAL_TABLET | ORAL | 5 refills | Status: DC

## 2019-09-28 MED ORDER — CLONAZEPAM 0.5 MG PO TABS: 0.5000 mg | ORAL_TABLET | Freq: Every day | ORAL | 2 refills | Status: DC

## 2019-09-28 NOTE — Progress Notes (Signed)
      [facility] Counselor/Therapist Progress Note  Patient ID: Melanie Cochran, MRN: [id number],    Date: 09/28/2019  Time Spent: 3:12-4:10 58 mins   Treatment Type: Individual Therapy  Reported Symptoms: irritation; less emotionally labile.  Mental Status Exam:  Appearance:   Casual and Well Groomed     Behavior:  Appropriate  Motor:  Normal  Speech/Language:   Clear and Coherent  Affect:  Labile, Full Range and Tearful  Mood:  irritable  Thought process:  normal  Thought content:    Rumination  Sensory/Perceptual disturbances:    Flashbacks  Orientation:  x4  Attention:  Good  Concentration:  Good  Memory:  WNL  Fund of knowledge:   Good  Insight:    Good  Judgment:   Good  Impulse Control:  Good and Fair- at times   Risk Assessment: Danger to Self:  No Self-injurious Behavior: No Danger to Others: No Duty to Warn:no Physical Aggression / Violence:No  Access to Firearms a concern: No  Gang Involvement:No   Subjective: Client brought her grandson into session today and struggled to not be distracted but explained this is why she feels tired or not appreciated by her kids who have their own family issues that spill onto her, but also processed how this makes her feel needed and she likes that. Therapist used MI & CBT with client to help validate her discovery and help her recognize what other ways she can feel needed as a grandma ("Nana") while also drawing boundaries. Therapist used family systems with client to further discuss past family dynamics of hers and her children's, to help her understand why her grandkids are experiencing what they are experiencing. Client made progress in session and in life by growing up quickly and prioritizing keeping her kids safe.    Interventions: Cognitive Behavioral Therapy, Motivational Interviewing and Family Systems  Diagnosis:   ICD-10-CM   1. Adjustment disorder with mixed anxiety and depressed mood  F43.23   2. PTSD  (post-traumatic stress disorder)  F43.10    Plan of Care: Client to return for weekly therapy with Sammuel Cooper, therapist, to review again in 6 months. Client to engage in positive self talk and challenging negative internal ruminationsand self talk causing client to be overly anxious and worriedusing CBT, on daily practice. Client to engage in mindfulness: ie body scans each eveneing to help process and discharge emotional distress & recognize emotions. Client to utilize BSP (brainspotting) with therapist to help client heal past traumas/ attachment issues to make way for: healing her need to be needed, regulate her anxiety/reduce depression by decreasing anxietyby 33% in the next 6 months, and help her attachment complications in romantic relationships  Client to prioritize sleep 8+ hours each week night AEB going to bed by 10pm each night.  Barnie Del, LCSW, LCAS, CCTP, CCS-I, BSP

## 2019-09-28 NOTE — Progress Notes (Signed)
Melanie Cochran [facility] [id number] May 13, 1975 44 y.o.  Subjective:   Patient ID:  Melanie Cochran is a 44 y.o. (DOB April 08, 1976) female.  Chief Complaint: No chief complaint on file.   HPI Melanie Cochran presents to the office today for follow-up of MDD, GAD, insomnia, and PTSD.   Describes mood today as "ok". Pleasant. Mood symptoms - denies depression, anxiety and irritability. Stating "I'm doing pretty good". Feels like she is "leveling" out. Has recovered from sinus infection - felt like medications may have effected "mood". Improved interest and motivation. Taking medications as prescribed.   Energy levels stable. Active, does not have a regular exercise routine. Works full-time.  Enjoys some usual interests and activities. Divorced. Is in a committed relationship - 3 years. Owns her home - daughter and her daughter living with her. Has 4 cats and 4 dogs. Spending time with family. Appetite adequate. Weight stable. Sleeping "fairly well". Averages 5 to 6 hours.  Focus and concentration stable. Completing tasks. Managing aspects of household. Work going well. Denies SI or HI. Denies AH or VH.  Previous medication trials: Multiple SSRI failures, Lamictal - rash  Review of Systems:  Review of Systems  Musculoskeletal: Negative for gait problem.  Neurological: Negative for tremors.  Psychiatric/Behavioral:       Please refer to HPI    Medications: I have reviewed the patient's current medications.  Current Outpatient Medications  Medication Sig Dispense Refill  . acetaminophen (TYLENOL) 500 MG tablet Take 1,000 mg by mouth every 6 (six) hours as needed (for pain.).     Marland Kitchen buPROPion (WELLBUTRIN XL) 300 MG 24 hr tablet Take one tablet in the morning. 30 tablet 5  . busPIRone (BUSPAR) 10 MG tablet TAKE 1 TABLET BY MOUTH THREE TIMES A DAY 270 tablet 1  . clonazePAM (KLONOPIN) 0.5 MG tablet Take 1 tablet (0.5 mg total) by mouth at bedtime. 30 tablet 2  . dexlansoprazole (DEXILANT) 60 MG capsule  Take 1 capsule (60 mg total) by mouth daily. 30 capsule 11  . diazepam (VALIUM) 5 MG tablet     . hydrochlorothiazide (HYDRODIURIL) 25 MG tablet Take 25 mg by mouth daily.     Marland Kitchen ibuprofen (ADVIL,MOTRIN) 200 MG tablet Take 400-600 mg by mouth every 8 (eight) hours as needed (for pain.).    Marland Kitchen meloxicam (MOBIC) 15 MG tablet Take 15 mg by mouth daily.    . predniSONE (STERAPRED UNI-PAK 21 TAB) 5 MG (21) TBPK tablet     . Probiotic Product (PROBIOTIC DAILY PO) Take 1 capsule by mouth daily.     . ranitidine (ZANTAC) 150 MG tablet Take 150 mg by mouth as needed for heartburn.     No current facility-administered medications for this visit.    Medication Side Effects: None  Allergies:  Allergies  Allergen Reactions  . Penicillins Other (See Comments)    Childhood reaction Has patient had a PCN reaction causing immediate rash, facial/tongue/throat swelling, SOB or lightheadedness with hypotension:unsure Has patient had a PCN reaction causing severe rash involving mucus membranes or skin necrosis:unsure Has patient had a PCN reaction that required hospitalization:doesn't think so Has patient had a PCN reaction occurring within the last 10 years:No If all of the above answers are "NO", then may proceed with Cephalosporin use.   . Lamictal [Lamotrigine] Rash    Past Medical History:  Diagnosis Date  . Hypertension     Family History  Problem Relation Age of Onset  . Breast cancer Mother 5  . Hypertension  Mother   . Hypertension Father   . Breast cancer Maternal Aunt   . Breast cancer Maternal Grandmother   . Colon cancer Neg Hx     Social History   Socioeconomic History  . Marital status: Divorced    Spouse name: Not on file  . Number of children: Not on file  . Years of education: Not on file  . Highest education level: Not on file  Occupational History  . Not on file  Tobacco Use  . Smoking status: Current Every Day Smoker    Packs/day: 0.50  . Smokeless tobacco: Never  Used  Substance and Sexual Activity  . Alcohol use: Yes    Comment: socially  . Drug use: No  . Sexual activity: Not on file  Other Topics Concern  . Not on file  Social History Narrative  . Not on file   Social Determinants of Health   Financial Resource Strain:   . Difficulty of Paying Living Expenses:   Food Insecurity:   . Worried About Charity fundraiser in the Last Year:   . Arboriculturist in the Last Year:   Transportation Needs:   . Film/video editor (Medical):   Marland Kitchen Lack of Transportation (Non-Medical):   Physical Activity:   . Days of Exercise per Week:   . Minutes of Exercise per Session:   Stress:   . Feeling of Stress :   Social Connections:   . Frequency of Communication with Friends and Family:   . Frequency of Social Gatherings with Friends and Family:   . Attends Religious Services:   . Active Member of Clubs or Organizations:   . Attends Archivist Meetings:   Marland Kitchen Marital Status:   Intimate Partner Violence:   . Fear of Current or Ex-Partner:   . Emotionally Abused:   Marland Kitchen Physically Abused:   . Sexually Abused:     Past Medical History, Surgical history, Social history, and Family history were reviewed and updated as appropriate.   Please see review of systems for further details on the patient's review from today.   Objective:   Physical Exam:  There were no vitals taken for this visit.  Physical Exam Constitutional:      General: She is not in acute distress. Musculoskeletal:        General: No deformity.  Neurological:     Mental Status: She is alert and oriented to person, place, and time.     Coordination: Coordination normal.  Psychiatric:        Attention and Perception: Attention and perception normal. She does not perceive auditory or visual hallucinations.        Mood and Affect: Mood normal. Mood is not anxious or depressed. Affect is not labile, blunt, angry or inappropriate.        Speech: Speech normal.         Behavior: Behavior normal.        Thought Content: Thought content normal. Thought content is not paranoid or delusional. Thought content does not include homicidal or suicidal ideation. Thought content does not include homicidal or suicidal plan.        Cognition and Memory: Cognition and memory normal.        Judgment: Judgment normal.     Comments: Insight intact     Lab Review:     Component Value Date/Time   PROT 7.1 01/29/2016 1038   ALBUMIN 4.2 01/29/2016 1038   AST 14 01/29/2016 1038  ALT 14 01/29/2016 1038   ALKPHOS 91 01/29/2016 1038   BILITOT 0.5 01/29/2016 1038       Component Value Date/Time   WBC 6.9 01/29/2016 1038   RBC 4.33 01/29/2016 1038   HGB 12.9 01/29/2016 1038   HCT 38.0 01/29/2016 1038   PLT 298 01/29/2016 1038   MCV 87.8 01/29/2016 1038   MCH 29.8 01/29/2016 1038   MCHC 33.9 01/29/2016 1038   RDW 13.1 01/29/2016 1038   LYMPHSABS 1,725 01/29/2016 1038   MONOABS 414 01/29/2016 1038   EOSABS 69 01/29/2016 1038   BASOSABS 0 01/29/2016 1038    No results found for: POCLITH, LITHIUM   No results found for: PHENYTOIN, PHENOBARB, VALPROATE, CBMZ   .res Assessment: Plan:    Plan:  PDMP reviewed  1. Buspar 10mg  TID - takes occasionally 2. Clonazepam 0.5mg  at hs as needed 3. Continue Wellbutrin XL 300mg .   Consider Prozac   Continue therapy with Sammuel Cooper  RTC 4 weeks  Patient advised to contact office with any questions, adverse effects, or acute worsening in signs and symptoms.  Discussed potential benefits, risk, and side effects of benzodiazepines to include potential risk of tolerance and dependence, as well as possible drowsiness.  Advised patient not to drive if experiencing drowsiness and to take lowest possible effective dose to minimize risk of dependence and tolerance.  Diagnoses and all orders for this visit:  Generalized anxiety disorder -     clonazePAM (KLONOPIN) 0.5 MG tablet; Take 1 tablet (0.5 mg total) by mouth at  bedtime. -     buPROPion (WELLBUTRIN XL) 300 MG 24 hr tablet; Take one tablet in the morning.  Insomnia, unspecified type -     clonazePAM (KLONOPIN) 0.5 MG tablet; Take 1 tablet (0.5 mg total) by mouth at bedtime.  Major depressive disorder, recurrent episode, moderate (HCC) -     buPROPion (WELLBUTRIN XL) 300 MG 24 hr tablet; Take one tablet in the morning.     Please see After Visit Summary for patient specific instructions.  Future Appointments  Date Time Provider [place]  09/28/2019  3:00 PM Barnie Del, LCSW [facility] None  10/17/2019  2:00 PM Barnie Del, LCSW [facility] None  10/31/2019  2:00 PM Barnie Del, LCSW CP-CP None  11/16/2019  2:00 PM Barnie Del, LCSW CP-CP None  11/30/2019  2:00 PM Barnie Del, LCSW [facility] None    No orders of the defined types were placed in this encounter.   -------------------------------

## 2019-10-12 ENCOUNTER — Other Ambulatory Visit: Payer: Self-pay | Admitting: Adult Health

## 2019-10-12 DIAGNOSIS — F411 Generalized anxiety disorder: Secondary | ICD-10-CM

## 2019-10-12 DIAGNOSIS — F331 Major depressive disorder, recurrent, moderate: Secondary | ICD-10-CM

## 2019-10-17 ENCOUNTER — Ambulatory Visit (INDEPENDENT_AMBULATORY_CARE_PROVIDER_SITE_OTHER): Payer: 59 | Admitting: Addiction (Substance Use Disorder)

## 2019-10-17 ENCOUNTER — Encounter: Payer: Self-pay | Admitting: Addiction (Substance Use Disorder)

## 2019-10-17 ENCOUNTER — Other Ambulatory Visit: Payer: Self-pay

## 2019-10-17 DIAGNOSIS — F4323 Adjustment disorder with mixed anxiety and depressed mood: Secondary | ICD-10-CM

## 2019-10-17 DIAGNOSIS — R451 Restlessness and agitation: Secondary | ICD-10-CM | POA: Diagnosis not present

## 2019-10-17 DIAGNOSIS — F439 Reaction to severe stress, unspecified: Secondary | ICD-10-CM | POA: Diagnosis not present

## 2019-10-17 DIAGNOSIS — F411 Generalized anxiety disorder: Secondary | ICD-10-CM | POA: Diagnosis not present

## 2019-10-17 NOTE — Progress Notes (Signed)
      [facility] Counselor/Therapist Progress Note  Patient ID: Melanie Cochran, MRN: [id number],    Date: 10/17/2019  Time Spent: 2:06-3:00 54 mins  Treatment Type: Individual Therapy  Reported Symptoms: edgy, jumpy, so angry/agitated, feeling like she could jump out of her own skin,emotionally labile.  Mental Status Exam:  Appearance:   Casual and Neat     Behavior:  Appropriate  Motor:  Normal  Speech/Language:   Clear and Coherent  Affect:  Congruent, Labile, Full Range and Tearful  Mood:  angry, anxious and irritable  Thought process:  normal  Thought content:    Rumination  Sensory/Perceptual disturbances:    Flashbacks  Orientation:  x4  Attention:  Good  Concentration:  Good  Memory:  WNL  Fund of knowledge:   Good  Insight:    Good  Judgment:   Good  Impulse Control:  Good and Fair- at times   Risk Assessment: Danger to Self:  No Self-injurious Behavior: No Danger to Others: No Duty to Warn:no Physical Aggression / Violence:No  Access to Firearms a concern: No  Gang Involvement:No   Subjective: Client reported symptoms such as feeling: edgy, jumpy,  angry/agitated, feeling like she could jump out of her own skin, and emotionally labile. Therapist used MI with client to validate client's frustration and mindfulness to help client regulate her mood swings. Therapist used MI & CBT with client to assess for client's stability and client denied SI/HI/AVH, but did report a consideration of a medication change/increase. Client shared her need to be able to emotionally regulate herself to keep her from going "crazy". Therapist used DBT with client to help her increased distress tolerance and emotion regulation to help her not feel like she's going to jump out of her skin (agitation-wise). Client discussed issues with parenting conflicts who disagrees with her bf carl. Therapist used CBT with client to help her understand more of her thoughts that lead to frustration. Therapist  used SFT with client to assist in client processing solutions for her discussion of her kids giving her their kids to babysit all the time. Therapist used family systems and client felt like it was taking advantage of her and therapist used roleplaying with client to help her practice being assertive with them.  Interventions: Cognitive Behavioral Therapy, Dialectical Behavioral Therapy, Motivational Interviewing, Solution-Oriented/Positive Psychology and Family Systems  Diagnosis:   ICD-10-CM   1. Generalized anxiety disorder  F41.1   2. Adjustment disorder with mixed anxiety and depressed mood  F43.23   3. Stress reaction with psychomotor agitation  F43.9    R45.1    Plan of Care: Client to return for weekly therapy with Sammuel Cooper, therapist, to review again in 6 months. Client to engage in positive self talk and challenging negative internal ruminationsand self talk causing client to be overly anxious and worriedusing CBT, on daily practice. Client to engage in mindfulness: ie body scans each eveneing to help process and discharge emotional distress & recognize emotions. Client to utilize BSP (brainspotting) with therapist to help client heal past traumas/ attachment issues to make way for: healing her need to be needed, regulate her anxiety/reduce depression by decreasing anxietyby 33% in the next 6 months, and help her attachment complications in romantic relationships  Client to prioritize sleep 8+ hours each week night AEB going to bed by 10pm each night.  Barnie Del, LCSW, LCAS, CCTP, CCS-I, BSP

## 2019-10-31 ENCOUNTER — Ambulatory Visit: Payer: 59 | Admitting: Adult Health

## 2019-10-31 ENCOUNTER — Ambulatory Visit: Payer: 59 | Admitting: Addiction (Substance Use Disorder)

## 2019-11-16 ENCOUNTER — Other Ambulatory Visit: Payer: Self-pay

## 2019-11-16 ENCOUNTER — Encounter: Payer: Self-pay | Admitting: Adult Health

## 2019-11-16 ENCOUNTER — Encounter: Payer: Self-pay | Admitting: Addiction (Substance Use Disorder)

## 2019-11-16 ENCOUNTER — Ambulatory Visit: Payer: 59 | Admitting: Addiction (Substance Use Disorder)

## 2019-11-16 ENCOUNTER — Ambulatory Visit (INDEPENDENT_AMBULATORY_CARE_PROVIDER_SITE_OTHER): Payer: 59 | Admitting: Adult Health

## 2019-11-16 DIAGNOSIS — F411 Generalized anxiety disorder: Secondary | ICD-10-CM | POA: Diagnosis not present

## 2019-11-16 DIAGNOSIS — G4700 Insomnia, unspecified: Secondary | ICD-10-CM

## 2019-11-16 DIAGNOSIS — F4310 Post-traumatic stress disorder, unspecified: Secondary | ICD-10-CM

## 2019-11-16 DIAGNOSIS — F331 Major depressive disorder, recurrent, moderate: Secondary | ICD-10-CM | POA: Diagnosis not present

## 2019-11-16 NOTE — Progress Notes (Signed)
[name] [facility] [id number] 08-01-1975 44 y.o.  Subjective:   Patient ID:  Melanie Cochran is a 44 y.o. (DOB May 13, 1975) female.  Chief Complaint: No chief complaint on file.   HPI Melanie Cochran presents to the office today for follow-up of MDD, GAD, insomnia, and PTSD.   Describes mood today as "ok". Pleasant. Mood symptoms - some "depression" - "worthlessness and what's the point" - "better but still there". Reports "self doubt and decreased self worth". Feels anxious most of the time. More irritable lately than she ever has been. Girl she works with says she's "snippy". Patience is on a "fine" line. Felt like she was doing good, but is not feeling like she had hoped she would. Improved interest and motivation. Taking medications as prescribed.   Energy levels stable. Active, does not have a regular exercise routine.  Enjoys some usual interests and activities. Divorced. Is in a committed relationship - 3 years. Owns her home - daughter and grand-daughter living with her. Has 4 cats, 1 bird, and 4 dogs. Spending time with family. Appetite adequate. Weight stable. Sleeping better some nights than others. Averages 6 hours - up and down throughout the night. Naps during the day some days.   Focus and concentration stable - "the same as always". Completing tasks. Managing aspects of household. Work going well - stressful. Denies SI or HI. Denies AH or VH.  Previous medication trials: Multiple SSRI failures, Lamictal - rash   Review of Systems:  Review of Systems  Musculoskeletal: Negative for gait problem.  Neurological: Negative for tremors.  Psychiatric/Behavioral:       Please refer to HPI    Medications: I have reviewed the patient's current medications.  Current Outpatient Medications  Medication Sig Dispense Refill  . acetaminophen (TYLENOL) 500 MG tablet Take 1,000 mg by mouth every 6 (six) hours as needed (for pain.).     Marland Kitchen busPIRone (BUSPAR) 10 MG tablet TAKE 1 TABLET BY  MOUTH THREE TIMES A DAY 270 tablet 1  . clonazePAM (KLONOPIN) 0.5 MG tablet Take 1 tablet (0.5 mg total) by mouth at bedtime. 30 tablet 2  . dexlansoprazole (DEXILANT) 60 MG capsule Take 1 capsule (60 mg total) by mouth daily. 30 capsule 11  . diazepam (VALIUM) 5 MG tablet     . hydrochlorothiazide (HYDRODIURIL) 25 MG tablet Take 25 mg by mouth daily.     Marland Kitchen ibuprofen (ADVIL,MOTRIN) 200 MG tablet Take 400-600 mg by mouth every 8 (eight) hours as needed (for pain.).    Marland Kitchen meloxicam (MOBIC) 15 MG tablet Take 15 mg by mouth daily.    . Probiotic Product (PROBIOTIC DAILY PO) Take 1 capsule by mouth daily.     . ranitidine (ZANTAC) 150 MG tablet Take 150 mg by mouth as needed for heartburn.     No current facility-administered medications for this visit.    Medication Side Effects: None  Allergies:  Allergies  Allergen Reactions  . Penicillins Other (See Comments)    Childhood reaction Has patient had a PCN reaction causing immediate rash, facial/tongue/throat swelling, SOB or lightheadedness with hypotension:unsure Has patient had a PCN reaction causing severe rash involving mucus membranes or skin necrosis:unsure Has patient had a PCN reaction that required hospitalization:doesn't think so Has patient had a PCN reaction occurring within the last 10 years:No If all of the above answers are "NO", then may proceed with Cephalosporin use.   . Lamictal [Lamotrigine] Rash    Past Medical History:  Diagnosis Date  .  Hypertension     Family History  Problem Relation Age of Onset  . Breast cancer Mother 67  . Hypertension Mother   . Hypertension Father   . Breast cancer Maternal Aunt   . Breast cancer Maternal Grandmother   . Colon cancer Neg Hx     Social History   Socioeconomic History  . Marital status: Divorced    Spouse name: Not on file  . Number of children: Not on file  . Years of education: Not on file  . Highest education level: Not on file  Occupational History  .  Not on file  Tobacco Use  . Smoking status: Current Every Day Smoker    Packs/day: 0.50  . Smokeless tobacco: Never Used  Substance and Sexual Activity  . Alcohol use: Yes    Comment: socially  . Drug use: No  . Sexual activity: Not on file  Other Topics Concern  . Not on file  Social History Narrative  . Not on file   Social Determinants of Health   Financial Resource Strain:   . Difficulty of Paying Living Expenses:   Food Insecurity:   . Worried About Charity fundraiser in the Last Year:   . Arboriculturist in the Last Year:   Transportation Needs:   . Film/video editor (Medical):   Marland Kitchen Lack of Transportation (Non-Medical):   Physical Activity:   . Days of Exercise per Week:   . Minutes of Exercise per Session:   Stress:   . Feeling of Stress :   Social Connections:   . Frequency of Communication with Friends and Family:   . Frequency of Social Gatherings with Friends and Family:   . Attends Religious Services:   . Active Member of Clubs or Organizations:   . Attends Archivist Meetings:   Marland Kitchen Marital Status:   Intimate Partner Violence:   . Fear of Current or Ex-Partner:   . Emotionally Abused:   Marland Kitchen Physically Abused:   . Sexually Abused:     Past Medical History, Surgical history, Social history, and Family history were reviewed and updated as appropriate.   Please see review of systems for further details on the patient's review from today.   Objective:   Physical Exam:  There were no vitals taken for this visit.  Physical Exam Constitutional:      General: She is not in acute distress. Musculoskeletal:        General: No deformity.  Neurological:     Mental Status: She is alert and oriented to person, place, and time.     Coordination: Coordination normal.  Psychiatric:        Attention and Perception: Attention and perception normal. She does not perceive auditory or visual hallucinations.        Mood and Affect: Mood normal. Mood is  not anxious or depressed. Affect is not labile, blunt, angry or inappropriate.        Speech: Speech normal.        Behavior: Behavior normal.        Thought Content: Thought content normal. Thought content is not paranoid or delusional. Thought content does not include homicidal or suicidal ideation. Thought content does not include homicidal or suicidal plan.        Cognition and Memory: Cognition and memory normal.        Judgment: Judgment normal.     Comments: Insight intact     Lab Review:  Component Value Date/Time   PROT 7.1 01/29/2016 1038   ALBUMIN 4.2 01/29/2016 1038   AST 14 01/29/2016 1038   ALT 14 01/29/2016 1038   ALKPHOS 91 01/29/2016 1038   BILITOT 0.5 01/29/2016 1038       Component Value Date/Time   WBC 6.9 01/29/2016 1038   RBC 4.33 01/29/2016 1038   HGB 12.9 01/29/2016 1038   HCT 38.0 01/29/2016 1038   PLT 298 01/29/2016 1038   MCV 87.8 01/29/2016 1038   MCH 29.8 01/29/2016 1038   MCHC 33.9 01/29/2016 1038   RDW 13.1 01/29/2016 1038   LYMPHSABS 1,725 01/29/2016 1038   MONOABS 414 01/29/2016 1038   EOSABS 69 01/29/2016 1038   BASOSABS 0 01/29/2016 1038    No results found for: POCLITH, LITHIUM   No results found for: PHENYTOIN, PHENOBARB, VALPROATE, CBMZ   .res Assessment: Plan:    Plan:  PDMP reviewed  1. Buspar 10mg  TID - takes occasionally 2. Clonazepam 0.5mg  at hs as needed 3. Decrease Wellbutrin XL 150mg  daily x 7 days, then d'c . 4. Add Trintellix 5mg  x 14 days, then 10mg  daily - samples given.   Continue therapy with Sammuel Cooper  RTC 8 weeks  Patient advised to contact office with any questions, adverse effects, or acute worsening in signs and symptoms.  Discussed potential benefits, risk, and side effects of benzodiazepines to include potential risk of tolerance and dependence, as well as possible drowsiness.  Advised patient not to drive if experiencing drowsiness and to take lowest possible effective dose to minimize risk of  dependence and tolerance.    Diagnoses and all orders for this visit:  Generalized anxiety disorder  Insomnia, unspecified type  Major depressive disorder, recurrent episode, moderate (HCC)  PTSD (post-traumatic stress disorder)     Please see After Visit Summary for patient specific instructions.  Future Appointments  Date Time Provider [place]  11/16/2019  2:00 PM Barnie Del, LCSW CP-CP None  11/30/2019  2:00 PM Barnie Del, LCSW CP-CP None  12/14/2019  3:00 PM Barnie Del, LCSW CP-CP None  12/28/2019  3:00 PM Barnie Del, LCSW CP-CP None    No orders of the defined types were placed in this encounter.   -------------------------------

## 2019-11-16 NOTE — Progress Notes (Signed)
      [facility] Counselor/Therapist Progress Note  Patient ID: Melanie Cochran, MRN: [id number],    Date: 11/16/2019  Time Spent: 2:07-3:04 57 mins  Treatment Type: Individual Therapy  Reported Symptoms: saddened, grief.  Mental Status Exam:  Appearance:   Casual and Neat     Behavior:  Appropriate  Motor:  Normal  Speech/Language:   Clear and Coherent  Affect:  Congruent, Labile, Full Range and Tearful  Mood:  angry, anxious and irritable  Thought process:  normal  Thought content:    Rumination  Sensory/Perceptual disturbances:    Flashbacks  Orientation:  x4  Attention:  Good  Concentration:  Good  Memory:  WNL  Fund of knowledge:   Good  Insight:    Good  Judgment:   Good  Impulse Control:  Good and Fair- at times   Risk Assessment: Danger to Self:  No Self-injurious Behavior: No Danger to Others: No Duty to Warn:no Physical Aggression / Violence:No  Access to Firearms a concern: No  Gang Involvement:No   Subjective: Client reported feeling saddened she cant be with people/man who feel safe for her emotionally. Client reported feeling sadness in her gut of a 8/10 SUDs when she thinks about people she doesn't have: 2 past loves of hers, in contrast with the confusion she feels all over when thinking about being with [name] (current bf) in a committed relationship. Therapist used MI & BSP with client to validate client's feelings, support her in processing sensations/emotions/questions she has, and rolling bsp with client. Client made progress in her honesty with herself about not feeling like she's enough, connected to her "daddy issues". Client SUDs decreased to a 3/10 until end of session and began to feel some relief from a need to decide about actions right now with her bf. Client also identified a deep longing for her newfound love language: words of affirmations, something she rarely got from her dad and doesn't get much from her bf. Therapist used CBT with client to  help her further explore her thoughts about words of affirmation from men in her life.   Interventions: Cognitive Behavioral Therapy, Motivational Interviewing and BSP  Diagnosis:   ICD-10-CM   1. PTSD (post-traumatic stress disorder)  F43.10    Plan of Care: Client to return for weekly therapy with Sammuel Cooper, therapist, to review again in 6 months. Client to engage in positive self talk and challenging negative internal ruminationsand self talk causing client to be overly anxious and worriedusing CBT, on daily practice. Client to engage in mindfulness: ie body scans each eveneing to help process and discharge emotional distress & recognize emotions. Client to utilize BSP (brainspotting) with therapist to help client heal past traumas/ attachment issues to make way for: healing her need to be needed, regulate her anxiety/reduce depression by decreasing anxietyby 33% in the next 6 months, and help her attachment complications in romantic relationships  Client to prioritize sleep 8+ hours each week night AEB going to bed by 10pm each night.  Barnie Del, LCSW, LCAS, CCTP, CCS-I, BSP

## 2019-11-23 ENCOUNTER — Other Ambulatory Visit: Payer: Self-pay | Admitting: Adult Health

## 2019-11-23 DIAGNOSIS — F331 Major depressive disorder, recurrent, moderate: Secondary | ICD-10-CM

## 2019-11-23 DIAGNOSIS — F411 Generalized anxiety disorder: Secondary | ICD-10-CM

## 2019-11-30 ENCOUNTER — Other Ambulatory Visit: Payer: Self-pay

## 2019-11-30 ENCOUNTER — Ambulatory Visit (INDEPENDENT_AMBULATORY_CARE_PROVIDER_SITE_OTHER): Payer: 59 | Admitting: Addiction (Substance Use Disorder)

## 2019-11-30 ENCOUNTER — Encounter: Payer: Self-pay | Admitting: Addiction (Substance Use Disorder)

## 2019-11-30 DIAGNOSIS — F411 Generalized anxiety disorder: Secondary | ICD-10-CM | POA: Diagnosis not present

## 2019-11-30 NOTE — Progress Notes (Signed)
      [facility] Counselor/Therapist Progress Note  Patient ID: Melanie Cochran, MRN: [id number],    Date: 11/30/2019  Time Spent: 53 mins  Treatment Type: Individual Therapy  Reported Symptoms: fearful, sad.  Mental Status Exam:  Appearance:   Casual and Neat     Behavior:  Appropriate  Motor:  Normal  Speech/Language:   Clear and Coherent  Affect:  Congruent, Labile, Full Range and Tearful  Mood:  anxious and sad  Thought process:  normal  Thought content:    Rumination  Sensory/Perceptual disturbances:    Flashbacks  Orientation:  x4  Attention:  Good  Concentration:  Good  Memory:  WNL  Fund of knowledge:   Good  Insight:    Good  Judgment:   Good  Impulse Control:  Good and Fair- at times   Risk Assessment: Danger to Self:  No Self-injurious Behavior: No Danger to Others: No Duty to Warn:no Physical Aggression / Violence:No  Access to Firearms a concern: No  Gang Involvement:No   Subjective: Client reported having ongoing fears and having a mental break last week after her son reported thinking about attempting suicide. Client shared about her emotional dysregulation and fearfulness. Client shared her desire to bring her son in for family therapy to discuss how his [place] is affecting hers. Therapist used MI & CBT with client to support her in the fears and pain of watching her son struggle with his [place] & family issues. Client reported making her son get checked at hosptial and go to the doctor to get MH support and medications and client's son no longer has SI. Therapist used family systems with client to help support client in discussing the difficulties of being a parent of a 59yo. Therapist assessed for safety and client denied SI/HI/AVH and reported starting on a new medication: trintellix, for her depression treatment and to help cope with her worries.    Interventions: Cognitive Behavioral Therapy, Motivational Interviewing and Family Systems  Diagnosis:    ICD-10-CM   1. Generalized anxiety disorder  F41.1    Plan of Care: Client to return for weekly therapy with Sammuel Cooper, therapist, to review again in 6 months. Client to engage in positive self talk and challenging negative internal ruminationsand self talk causing client to be overly anxious and worriedusing CBT, on daily practice. Client to engage in mindfulness: ie body scans each eveneing to help process and discharge emotional distress & recognize emotions. Client to utilize BSP (brainspotting) with therapist to help client heal past traumas/ attachment issues to make way for: healing her need to be needed, regulate her anxiety/reduce depression by decreasing anxietyby 33% in the next 6 months, and help her attachment complications in romantic relationships  Client to prioritize sleep 8+ hours each week night AEB going to bed by 10pm each night.  Barnie Del, LCSW, LCAS, CCTP, CCS-I, BSP

## 2019-12-14 ENCOUNTER — Ambulatory Visit (INDEPENDENT_AMBULATORY_CARE_PROVIDER_SITE_OTHER): Payer: 59 | Admitting: Addiction (Substance Use Disorder)

## 2019-12-14 ENCOUNTER — Other Ambulatory Visit: Payer: Self-pay

## 2019-12-14 ENCOUNTER — Encounter: Payer: Self-pay | Admitting: Addiction (Substance Use Disorder)

## 2019-12-14 DIAGNOSIS — F4310 Post-traumatic stress disorder, unspecified: Secondary | ICD-10-CM | POA: Diagnosis not present

## 2019-12-14 DIAGNOSIS — F411 Generalized anxiety disorder: Secondary | ICD-10-CM

## 2019-12-14 NOTE — Progress Notes (Signed)
      [facility] Counselor/Therapist Progress Note  Patient ID: Addylynn Balin, MRN: [id number],    Date: 12/14/2019  Time Spent: 69 mins  Treatment Type: Individual Therapy  Reported Symptoms: anxious/worried/upset.  Mental Status Exam:  Appearance:   Casual and Neat     Behavior:  Appropriate  Motor:  Normal  Speech/Language:   Clear and Coherent  Affect:  Appropriate, Congruent, Full Range and Tearful  Mood:  anxious  Thought process:  normal  Thought content:    Rumination  Sensory/Perceptual disturbances:    Flashbacks  Orientation:  x4  Attention:  Good  Concentration:  Good  Memory:  WNL  Fund of knowledge:   Good  Insight:    Good  Judgment:   Good  Impulse Control:  Good and Fair- at times   Risk Assessment: Danger to Self:  No Self-injurious Behavior: No Danger to Others: No Duty to Warn:no Physical Aggression / Violence:No  Access to Firearms a concern: No  Gang Involvement:No   Subjective: Client invited her son into session today with her. Client expressed him and his pain being a source of her pain/anxiety/etc. Client and her son processed his SI attempt and emotional instability and his need for therapy and medication to assist in processing his old childhood ruptured attachment wounds along with his new abandonment issues from his gf leaving with his son. Therapist used MI, CBT & family systems in session with client and client's son to validate emotions, challenge emotional cognitive distortions, and encourage certain healthy coping mechanisms for him to deal with his anxiety while staying involved as a father from [place]. Client reported her gratitude for letting her son come to therapy to talk with therapist also, and being able to be a safe place for her son to process his pain/anxiety, as the client's mom was for her until she passed away. Therapist assessed client and client's son for safety and both denied SI/HI/AVH.  Interventions: Cognitive Behavioral  Therapy, Motivational Interviewing and Family Systems  Diagnosis:   ICD-10-CM   1. PTSD (post-traumatic stress disorder)  F43.10   2. Generalized anxiety disorder  F41.1    Plan of Care: Client to return for weekly therapy with Sammuel Cooper, therapist, to review again in 6 months. Client to engage in positive self talk and challenging negative internal ruminationsand self talk causing client to be overly anxious and worriedusing CBT, on daily practice. Client to engage in mindfulness: ie body scans each eveneing to help process and discharge emotional distress & recognize emotions. Client to utilize BSP (brainspotting) with therapist to help client heal past traumas/ attachment issues to make way for: healing her need to be needed, regulate her anxiety/reduce depression by decreasing anxietyby 33% in the next 6 months, and help her attachment complications in romantic relationships  Client to prioritize sleep 8+ hours each week night AEB going to bed by 10pm each night.  Barnie Del, LCSW, LCAS, CCTP, CCS-I, BSP

## 2019-12-28 ENCOUNTER — Ambulatory Visit: Payer: 59 | Admitting: Addiction (Substance Use Disorder)

## 2020-01-03 ENCOUNTER — Telehealth (INDEPENDENT_AMBULATORY_CARE_PROVIDER_SITE_OTHER): Payer: 59 | Admitting: Adult Health

## 2020-01-03 ENCOUNTER — Encounter: Payer: Self-pay | Admitting: Adult Health

## 2020-01-03 DIAGNOSIS — F331 Major depressive disorder, recurrent, moderate: Secondary | ICD-10-CM

## 2020-01-03 DIAGNOSIS — G4700 Insomnia, unspecified: Secondary | ICD-10-CM | POA: Diagnosis not present

## 2020-01-03 DIAGNOSIS — F4310 Post-traumatic stress disorder, unspecified: Secondary | ICD-10-CM | POA: Diagnosis not present

## 2020-01-03 DIAGNOSIS — F411 Generalized anxiety disorder: Secondary | ICD-10-CM

## 2020-01-03 MED ORDER — BUPROPION HCL ER (SR) 100 MG PO TB12
100.0000 mg | ORAL_TABLET | Freq: Two times a day (BID) | ORAL | 2 refills | Status: DC
Start: 2020-01-03 — End: 2020-01-26

## 2020-01-03 NOTE — Progress Notes (Signed)
Melanie Cochran [facility] [id number] [birth year] 44 y.o.  Virtual Visit via Telephone Note  I connected with pt on 01/03/20 at  3:40 PM EDT by telephone and verified that I am speaking with the correct person using two identifiers.   I discussed the limitations, risks, security and privacy concerns of performing an evaluation and management service by telephone and the availability of in person appointments. I also discussed with the patient that there may be a patient responsible charge related to this service. The patient expressed understanding and agreed to proceed.   I discussed the assessment and treatment plan with the patient. The patient was provided an opportunity to ask questions and all were answered. The patient agreed with the plan and demonstrated an understanding of the instructions.   The patient was advised to call back or seek an in-person evaluation if the symptoms worsen or if the condition fails to improve as anticipated.  I provided 30 minutes of non-face-to-face time during this encounter.  The patient was located at home.  The provider was located at [place].   Melanie Gell, NP   Subjective:   Patient ID:  Melanie Cochran is a 44 y.o. (DOB Nov 13, 1975) female.  Chief Complaint: No chief complaint on file.   HPI Melanie Cochran presents for follow-up of MDD, GAD, insomnia, and PTSD.   Describes mood today as "ok". Pleasant. Mood symptoms - reports some "depression" - "some days are better than others". Stating "I'm still wallowing in it". Feels anxious "all the time" - shaking legs - nervous energy. Denies feeling irritable. Wanting to sleep on Trintellix. Stating "I don't think it's helpful". Having more "wallering" days. Not wanting to eat. Felt jittery on the Wellbutrin, but it did work for her. Varying interest and motivation. Taking medications as prescribed.   Energy levels low. Active, does not have a regular exercise routine.  Enjoys some usual  interests and activities. Divorced. Is in a committed relationship - 3 years. Owns her home - daughter and grand-daughter living with her. Has 4 cats, 1 bird, and 4 dogs. Spending time with family. Appetite adequate. Weight stable. Sleeping better some nights than others. Averages 6 to 7 hours a night. Listening to nature sounds - "that has helped". Naps during the day most days.   Focus and concentration "a little off" stable. Completing tasks. Managing aspects of household. Work going well Optometrist. Denies SI or HI. Denies AH or VH.  Previous medication trials: Multiple SSRI failures, Lamictal - rash  Review of Systems:  Review of Systems  Musculoskeletal: Negative for gait problem.  Neurological: Negative for tremors.  Psychiatric/Behavioral:       Please refer to HPI    Medications: I have reviewed the patient's current medications.  Current Outpatient Medications  Medication Sig Dispense Refill   acetaminophen (TYLENOL) 500 MG tablet Take 1,000 mg by mouth every 6 (six) hours as needed (for pain.).      busPIRone (BUSPAR) 10 MG tablet TAKE 1 TABLET BY MOUTH THREE TIMES A DAY 270 tablet 1   clonazePAM (KLONOPIN) 0.5 MG tablet Take 1 tablet (0.5 mg total) by mouth at bedtime. 30 tablet 2   dexlansoprazole (DEXILANT) 60 MG capsule Take 1 capsule (60 mg total) by mouth daily. 30 capsule 11   diazepam (VALIUM) 5 MG tablet      hydrochlorothiazide (HYDRODIURIL) 25 MG tablet Take 25 mg by mouth daily.      ibuprofen (ADVIL,MOTRIN) 200 MG tablet Take 400-600 mg  by mouth every 8 (eight) hours as needed (for pain.).     meloxicam (MOBIC) 15 MG tablet Take 15 mg by mouth daily.     Probiotic Product (PROBIOTIC DAILY PO) Take 1 capsule by mouth daily.      ranitidine (ZANTAC) 150 MG tablet Take 150 mg by mouth as needed for heartburn.     No current facility-administered medications for this visit.    Medication Side Effects: None  Allergies:  Allergies  Allergen  Reactions   Penicillins Other (See Comments)    Childhood reaction Has patient had a PCN reaction causing immediate rash, facial/tongue/throat swelling, SOB or lightheadedness with hypotension:unsure Has patient had a PCN reaction causing severe rash involving mucus membranes or skin necrosis:unsure Has patient had a PCN reaction that required hospitalization:doesn't think so Has patient had a PCN reaction occurring within the last 10 years:No If all of the above answers are "NO", then may proceed with Cephalosporin use.    Lamictal [Lamotrigine] Rash    Past Medical History:  Diagnosis Date   Hypertension     Family History  Problem Relation Age of Onset   Breast cancer Mother 38   Hypertension Mother    Hypertension Father    Breast cancer Maternal Aunt    Breast cancer Maternal Grandmother    Colon cancer Neg Hx     Social History   Socioeconomic History   Marital status: Divorced    Spouse name: Not on file   Number of children: Not on file   Years of education: Not on file   Highest education level: Not on file  Occupational History   Not on file  Tobacco Use   Smoking status: Current Every Day Smoker    Packs/day: 0.50   Smokeless tobacco: Never Used  Substance and Sexual Activity   Alcohol use: Yes    Comment: socially   Drug use: No   Sexual activity: Not on file  Other Topics Concern   Not on file  Social History Narrative   Not on file   Social Determinants of Health   Financial Resource Strain:    Difficulty of Paying Living Expenses: Not on file  Food Insecurity:    Worried About Running Out of Food in the Last Year: Not on file   Ran Out of Food in the Last Year: Not on file  Transportation Needs:    Lack of Transportation (Medical): Not on file   Lack of Transportation (Non-Medical): Not on file  Physical Activity:    Days of Exercise per Week: Not on file   Minutes of Exercise per Session: Not on file  Stress:     Feeling of Stress : Not on file  Social Connections:    Frequency of Communication with Friends and Family: Not on file   Frequency of Social Gatherings with Friends and Family: Not on file   Attends Religious Services: Not on file   Active Member of Clubs or Organizations: Not on file   Attends Archivist Meetings: Not on file   Marital Status: Not on file  Intimate Partner Violence:    Fear of Current or Ex-Partner: Not on file   Emotionally Abused: Not on file   Physically Abused: Not on file   Sexually Abused: Not on file    Past Medical History, Surgical history, Social history, and Family history were reviewed and updated as appropriate.   Please see review of systems for further details on the patient's review from  today.   Objective:   Physical Exam:  There were no vitals taken for this visit.  Physical Exam Constitutional:      General: She is not in acute distress. Musculoskeletal:        General: No deformity.  Neurological:     Mental Status: She is alert and oriented to person, place, and time.     Coordination: Coordination normal.  Psychiatric:        Attention and Perception: Attention and perception normal. She does not perceive auditory or visual hallucinations.        Mood and Affect: Mood normal. Mood is not anxious or depressed. Affect is not labile, blunt, angry or inappropriate.        Speech: Speech normal.        Behavior: Behavior normal.        Thought Content: Thought content normal. Thought content is not paranoid or delusional. Thought content does not include homicidal or suicidal ideation. Thought content does not include homicidal or suicidal plan.        Cognition and Memory: Cognition and memory normal.        Judgment: Judgment normal.     Comments: Insight intact     Lab Review:     Component Value Date/Time   PROT 7.1 01/29/2016 1038   ALBUMIN 4.2 01/29/2016 1038   AST 14 01/29/2016 1038   ALT 14  01/29/2016 1038   ALKPHOS 91 01/29/2016 1038   BILITOT 0.5 01/29/2016 1038       Component Value Date/Time   WBC 6.9 01/29/2016 1038   RBC 4.33 01/29/2016 1038   HGB 12.9 01/29/2016 1038   HCT 38.0 01/29/2016 1038   PLT 298 01/29/2016 1038   MCV 87.8 01/29/2016 1038   MCH 29.8 01/29/2016 1038   MCHC 33.9 01/29/2016 1038   RDW 13.1 01/29/2016 1038   LYMPHSABS 1,725 01/29/2016 1038   MONOABS 414 01/29/2016 1038   EOSABS 69 01/29/2016 1038   BASOSABS 0 01/29/2016 1038    No results found for: POCLITH, LITHIUM   No results found for: PHENYTOIN, PHENOBARB, VALPROATE, CBMZ   .res Assessment: Plan:    Plan:  PDMP reviewed  1. Buspar 10mg  TID - takes occasionally 2. Clonazepam 0.5mg  at hs as needed 3. Add Wellbutrin SR 100mg  BID - denies seizure history 4. D/C Trintellix 5mg  daily x 7, then discontinue  Continue therapy with Sammuel Cooper  RTC 8 weeks  Patient advised to contact office with any questions, adverse effects, or acute worsening in signs and symptoms.  Discussed potential benefits, risk, and side effects of benzodiazepines to include potential risk of tolerance and dependence, as well as possible drowsiness.  Advised patient not to drive if experiencing drowsiness and to take lowest possible effective dose to minimize risk of dependence and tolerance.    There are no diagnoses linked to this encounter.  Please see After Visit Summary for patient specific instructions.  Future Appointments  Date Time Provider [place]  01/11/2020  3:00 PM Barnie Del, LCSW [facility] None  01/26/2020  3:00 PM Barnie Del, LCSW [facility] None  02/09/2020  3:00 PM Barnie Del, LCSW [facility] None  02/23/2020  3:00 PM Barnie Del, LCSW [facility] None  03/08/2020  3:00 PM Barnie Del, LCSW [facility] None    No orders of the defined types were placed in this encounter.     -------------------------------

## 2020-01-11 ENCOUNTER — Encounter: Payer: Self-pay | Admitting: Addiction (Substance Use Disorder)

## 2020-01-11 ENCOUNTER — Ambulatory Visit (INDEPENDENT_AMBULATORY_CARE_PROVIDER_SITE_OTHER): Payer: 59 | Admitting: Addiction (Substance Use Disorder)

## 2020-01-11 ENCOUNTER — Other Ambulatory Visit: Payer: Self-pay

## 2020-01-11 ENCOUNTER — Ambulatory Visit: Payer: 59 | Admitting: Adult Health

## 2020-01-11 DIAGNOSIS — F4310 Post-traumatic stress disorder, unspecified: Secondary | ICD-10-CM | POA: Diagnosis not present

## 2020-01-11 NOTE — Progress Notes (Signed)
      [facility] Counselor/Therapist Progress Note  Patient ID: Melanie Cochran, MRN: [id number],    Date: 01/11/2020  Time Spent: 8mins  Treatment Type: Individual Therapy  Reported Symptoms: sad/lonely  Mental Status Exam:  Appearance:   Casual and Neat     Behavior:  Appropriate  Motor:  Normal  Speech/Language:   Clear and Coherent  Affect:  Appropriate, Congruent, Full Range and Tearful  Mood:  anxious and sad  Thought process:  normal  Thought content:    Rumination  Sensory/Perceptual disturbances:    Flashbacks  Orientation:  x4  Attention:  Good  Concentration:  Good  Memory:  WNL  Fund of knowledge:   Good  Insight:    Good  Judgment:   Good  Impulse Control:  Good and Fair- at times   Risk Assessment: Danger to Self:  No Self-injurious Behavior: No Danger to Others: No Duty to Warn:no Physical Aggression / Violence:No  Access to Firearms a concern: No  Gang Involvement:No   Subjective: Client reported feeling sad and lonely after realizing she is still trying to find herself. Client discussed the need to be wanted/needed by men and looking for the desire from a man to help her feel secure/content. Therapist used MI & CBt with client to support her in processing her thoughts and underlying feelings associated with them. Client realized she wants to fix things because it makes her feel less vulnerable and exposed. Therapist used mindfulness to help client sit in her discomfort to get to the bottom of her feelings: feeling alone and disconnected from herself, her God, and her bf. Therapist assessed client for safety and client denied SI/HI/AVH and reported increased stability on her new medication.   Interventions: Cognitive Behavioral Therapy, Mindfulness Meditation and Motivational Interviewing  Diagnosis:   ICD-10-CM   1. PTSD (post-traumatic stress disorder)  F43.10     Plan of Care: Client to return for weekly therapy with Sammuel Cooper, therapist, to  review again in 6 months. Client to engage in positive self talk and challenging negative internal ruminationsand self talk causing client to be overly anxious and worriedusing CBT, on daily practice. Client to engage in mindfulness: ie body scans each eveneing to help process and discharge emotional distress & recognize emotions. Client to utilize BSP (brainspotting) with therapist to help client heal past traumas/ attachment issues to make way for: healing her need to be needed, regulate her anxiety/reduce depression by decreasing anxietyby 33% in the next 6 months, and help her attachment complications in romantic relationships  Client to prioritize sleep 8+ hours each week night AEB going to bed by 10pm each night.  Barnie Del, LCSW, LCAS, CCTP, CCS-I, BSP

## 2020-01-26 ENCOUNTER — Other Ambulatory Visit: Payer: Self-pay | Admitting: Adult Health

## 2020-01-26 ENCOUNTER — Ambulatory Visit (INDEPENDENT_AMBULATORY_CARE_PROVIDER_SITE_OTHER): Payer: 59 | Admitting: Addiction (Substance Use Disorder)

## 2020-01-26 ENCOUNTER — Encounter: Payer: Self-pay | Admitting: Addiction (Substance Use Disorder)

## 2020-01-26 DIAGNOSIS — F411 Generalized anxiety disorder: Secondary | ICD-10-CM

## 2020-01-26 NOTE — Progress Notes (Signed)
      [facility] Counselor/Therapist Progress Note  Patient ID: Melanie Cochran, MRN: [id number],    Date: 01/26/2020  Time Spent: 59mins   Treatment Type: Individual Therapy  Reported Symptoms: happy & more grounded.  Mental Status Exam:  Appearance:   Casual and Neat     Behavior:  Appropriate  Motor:  Normal  Speech/Language:   Clear and Coherent  Affect:  Appropriate, Congruent and Full Range  Mood:  normal  Thought process:  normal  Thought content:    Rumination  Sensory/Perceptual disturbances:    Flashbacks  Orientation:  x4  Attention:  Good  Concentration:  Good  Memory:  WNL  Fund of knowledge:   Good  Insight:    Good  Judgment:   Good  Impulse Control:  Good and Fair- at times   Risk Assessment: Danger to Self:  No Self-injurious Behavior: No Danger to Others: No Duty to Warn:no Physical Aggression / Violence:No  Access to Firearms a concern: No  Gang Involvement:No   Subjective: Client reported feelinghappy & more grounded since making a few changes in her personal life that has made her feel more stabilized and hopeful about her recovery. Client reported stopped taking her medication due to side effects and needing to find one to help her further feel more grounded in her own skin & calming her thoughts. Therapist used MI &SFT with client to validate her progress, support her in her search for assistance, and to help her think of methods of self-soothing and coping aside from medication. Therapist assessed for safety and client denied SI/HI/AVH.  Interventions: Mindfulness Meditation, Motivational Interviewing and Solution-Oriented/Positive Psychology  Diagnosis:   ICD-10-CM   1. Generalized anxiety disorder  F41.1      Plan of Care: Client to return for weekly therapy with Sammuel Cooper, therapist, to review again in 6 months. Client to engage in positive self talk and challenging negative internal ruminationsand self talk causing client to be overly  anxious and worriedusing CBT, on daily practice. Client to engage in mindfulness: ie body scans each eveneing to help process and discharge emotional distress & recognize emotions. Client to utilize BSP (brainspotting) with therapist to help client heal past traumas/ attachment issues to make way for: healing her need to be needed, regulate her anxiety/reduce depression by decreasing anxietyby 33% in the next 6 months, and help her attachment complications in romantic relationships  Client to prioritize sleep 8+ hours each week night AEB going to bed by 10pm each night.  Barnie Del, LCSW, LCAS, CCTP, CCS-I, BSP

## 2020-02-08 ENCOUNTER — Ambulatory Visit (INDEPENDENT_AMBULATORY_CARE_PROVIDER_SITE_OTHER): Payer: 59 | Admitting: Addiction (Substance Use Disorder)

## 2020-02-08 ENCOUNTER — Encounter: Payer: Self-pay | Admitting: Addiction (Substance Use Disorder)

## 2020-02-08 DIAGNOSIS — F4323 Adjustment disorder with mixed anxiety and depressed mood: Secondary | ICD-10-CM

## 2020-02-08 DIAGNOSIS — F4310 Post-traumatic stress disorder, unspecified: Secondary | ICD-10-CM | POA: Diagnosis not present

## 2020-02-08 NOTE — Progress Notes (Signed)
      [facility] Counselor/Therapist Progress Note  Patient ID: Melanie Cochran, MRN: [id number],    Date: 02/08/2020  Time Spent: 80mins  Treatment Type: Individual Therapy  Reported Symptoms: confident.  Mental Status Exam:  Appearance:   Casual and Well Groomed     Behavior:  Appropriate  Motor:  Normal  Speech/Language:   Clear and Coherent  Affect:  Appropriate, Congruent and Full Range  Mood:  normal  Thought process:  normal  Thought content:    Rumination  Sensory/Perceptual disturbances:    Flashbacks  Orientation:  x4  Attention:  Good  Concentration:  Good  Memory:  WNL  Fund of knowledge:   Good  Insight:    Good  Judgment:   Good  Impulse Control:  Good and Fair- at times   Risk Assessment: Danger to Self:  No Self-injurious Behavior: No Danger to Others: No Duty to Warn:no Physical Aggression / Violence:No  Access to Firearms a concern: No  Gang Involvement:No   Subjective: Client reported feeling much more confident in drawing boundaries with her children and this is helping her emotionally regulate and feel more grounded. Client reported less mood swings and tearfulness and stabilizing without her medication. Therapist used MI with client to support her and validate her challenges and progress with boundaries. Therapist used psychoeducation with client to teach her about sharing burdens and loads, inquiring about client's tendencies with trying to carry her kids' loads when only they can. Client made progress identifying a plan for how to carry their burdens and encourage them, while not taking on their load for her to try to carry. Therapist assessed for safety and client denied SI/HI/AVH.  Interventions: Motivational Interviewing, Solution-Oriented/Positive Psychology and Psycho-education/Bibliotherapy  Diagnosis:   ICD-10-CM   1. Adjustment disorder with mixed anxiety and depressed mood  F43.23   2. PTSD (post-traumatic stress disorder)  F43.10       Plan of Care: Client to return for weekly therapy with Sammuel Cooper, therapist, to review again in 6 months. Client to engage in positive self talk and challenging negative internal ruminationsand self talk causing client to be overly anxious and worriedusing CBT, on daily practice. Client to engage in mindfulness: ie body scans each eveneing to help process and discharge emotional distress & recognize emotions. Client to utilize BSP (brainspotting) with therapist to help client heal past traumas/ attachment issues to make way for: healing her need to be needed, regulate her anxiety/reduce depression by decreasing anxietyby 33% in the next 6 months, and help her attachment complications in romantic relationships  Client to prioritize sleep 8+ hours each week night AEB going to bed by 10pm each night.  Melanie Del, LCSW, LCAS, CCTP, CCS-I, BSP

## 2020-02-09 ENCOUNTER — Ambulatory Visit: Payer: 59 | Admitting: Addiction (Substance Use Disorder)

## 2020-02-10 ENCOUNTER — Other Ambulatory Visit: Payer: Self-pay | Admitting: Nurse Practitioner

## 2020-02-10 DIAGNOSIS — Z1231 Encounter for screening mammogram for malignant neoplasm of breast: Secondary | ICD-10-CM

## 2020-02-23 ENCOUNTER — Ambulatory Visit: Payer: 59 | Admitting: Addiction (Substance Use Disorder)

## 2020-03-01 ENCOUNTER — Other Ambulatory Visit: Payer: Self-pay

## 2020-03-01 ENCOUNTER — Ambulatory Visit
Admission: RE | Admit: 2020-03-01 | Discharge: 2020-03-01 | Disposition: A | Discharge status: Home / Self Care | Payer: 59 | Source: Ambulatory Visit | Attending: Nurse Practitioner | Admitting: Nurse Practitioner

## 2020-03-01 DIAGNOSIS — Z1231 Encounter for screening mammogram for malignant neoplasm of breast: Secondary | ICD-10-CM

## 2020-03-05 ENCOUNTER — Other Ambulatory Visit: Payer: Self-pay | Admitting: Nurse Practitioner

## 2020-03-05 DIAGNOSIS — R928 Other abnormal and inconclusive findings on diagnostic imaging of breast: Secondary | ICD-10-CM

## 2020-03-08 ENCOUNTER — Ambulatory Visit (INDEPENDENT_AMBULATORY_CARE_PROVIDER_SITE_OTHER): Payer: 59 | Admitting: Addiction (Substance Use Disorder)

## 2020-03-08 DIAGNOSIS — F4323 Adjustment disorder with mixed anxiety and depressed mood: Secondary | ICD-10-CM

## 2020-03-08 NOTE — Progress Notes (Signed)
      [facility] Counselor/Therapist Progress Note  Patient ID: Melanie Cochran, MRN: [id number],    Date: 03/08/2020  Time Spent: 67mins  Treatment Type: Individual Therapy  Reported Symptoms: regulated   Mental Status Exam:  Appearance:   Casual and Well Groomed     Behavior:  Appropriate  Motor:  Normal  Speech/Language:   Clear and Coherent  Affect:  Appropriate, Congruent and Full Range  Mood:  normal  Thought process:  normal  Thought content:    Rumination  Sensory/Perceptual disturbances:    Flashbacks  Orientation:  x4  Attention:  Good  Concentration:  Good  Memory:  WNL  Fund of knowledge:   Good  Insight:    Good  Judgment:   Good  Impulse Control:  Good and Fair- at times   Risk Assessment: Danger to Self:  No Self-injurious Behavior: No Danger to Others: No Duty to Warn:no Physical Aggression / Violence:No  Access to Firearms a concern: No  Gang Involvement:No   Subjective: Client reported feeling more regulated but staring a new medication this week for her anxiety and mood swings. Client talked about her children's issues and how that affects her and her relationship with her bf. Client reported some conflicts with her discontentment with him and therapist used MI with client to support her, validate her fears and help motivate her to work through her frustrations/expectations. Therapist assessed for safety and client denied SI/HI/AVH.  Interventions: Motivational Interviewing  Diagnosis:   ICD-10-CM   1. Adjustment disorder with mixed anxiety and depressed mood  F43.23      Plan of Care: Client to return for weekly therapy with Sammuel Cooper, therapist, to review again in 6 months. Client to engage in positive self talk and challenging negative internal ruminationsand self talk causing client to be overly anxious and worriedusing CBT, on daily practice. Client to engage in mindfulness: ie body scans each eveneing to help process and discharge  emotional distress & recognize emotions. Client to utilize BSP (brainspotting) with therapist to help client heal past traumas/ attachment issues to make way for: healing her need to be needed, regulate her anxiety/reduce depression by decreasing anxietyby 33% in the next 6 months, and help her attachment complications in romantic relationships  Client to prioritize sleep 8+ hours each week night AEB going to bed by 10pm each night.  Barnie Del, LCSW, LCAS, CCTP, CCS-I, BSP

## 2020-03-21 ENCOUNTER — Other Ambulatory Visit: Payer: Self-pay

## 2020-03-21 ENCOUNTER — Ambulatory Visit
Admission: RE | Admit: 2020-03-21 | Discharge: 2020-03-21 | Disposition: A | Discharge status: Home / Self Care | Payer: 59 | Source: Ambulatory Visit | Attending: Nurse Practitioner | Admitting: Nurse Practitioner

## 2020-03-21 DIAGNOSIS — R928 Other abnormal and inconclusive findings on diagnostic imaging of breast: Secondary | ICD-10-CM

## 2020-03-22 ENCOUNTER — Ambulatory Visit: Payer: 59 | Admitting: Addiction (Substance Use Disorder)

## 2020-05-09 ENCOUNTER — Ambulatory Visit (INDEPENDENT_AMBULATORY_CARE_PROVIDER_SITE_OTHER): Payer: 59 | Admitting: Addiction (Substance Use Disorder)

## 2020-05-09 DIAGNOSIS — F411 Generalized anxiety disorder: Secondary | ICD-10-CM

## 2020-05-09 NOTE — Progress Notes (Signed)
[facility] Counselor/Therapist Progress Note  Patient ID: Melanie Cochran, MRN: [id number],    Date: 05/09/2020  Time Spent:  Treatment Type: Individual Therapy  Reported Symptoms: regulated   Mental Status Exam:  Appearance:   Casual and Well Groomed     Behavior:  Appropriate  Motor:  Normal  Speech/Language:   Clear and Coherent  Affect:  Appropriate, Congruent and Full Range  Mood:  normal  Thought process:  normal  Thought content:    Rumination  Sensory/Perceptual disturbances:    Flashbacks  Orientation:  x4  Attention:  Good  Concentration:  Good  Memory:  WNL  Fund of knowledge:   Good  Insight:    Good  Judgment:   Good  Impulse Control:  Good and Fair- at times   Risk Assessment: Danger to Self:  No Self-injurious Behavior: No Danger to Others: No Duty to Warn:no Physical Aggression / Violence:No  Access to Firearms a concern: No  Gang Involvement:No   .Virtual Visit via VIDEO:  I connected with client by MyChart video enabled telemedicine/telehealth application with their informed consent, and verified client privacy and that I am speaking with the correct person using two identifiers. I discussed the limitations, risks, security and privacy concerns of performing psychotherapy and management service virtually and confirmed their location. I also discussed with the patient that there may be a patient responsible charge related to this service and to confirm with the front desk if their insurance covers teletherapy. I also discussed with the patient the availability of in person appointments. The patient expressed understanding and agreed to proceed. I discussed the treatment planning with the client. The client was provided an opportunity to ask questions and all were answered. The client agreed with the plan and demonstrated an understanding of the instructions. The client was advised to call our office if symptoms worsen or feel they are in a  crisis state and need immediate contact. Client also reminded of a crisis line number and to use 9-1-1 if there's an emergency.  Therapist Location: office; Client Location: work.  Subjective: Client reported doing well emotionally and coping using more of her RP plan coping skills. Client processed the distress around her son and making her grandson not be able to see him. Client concerned aobut his developmental delays due to them not engaging with him enough. Client frustrated and trying to find a balance of only intervening when necessary. Client making progress regulating herself emotionally. Therapist used MI & RPT to support client and encourage continued self care.Therapist assessed for safety and client denied SI/HI/AVH.  Interventions: Motivational Interviewing and RPT  Diagnosis:   ICD-10-CM   1. Generalized anxiety disorder  F41.1      Plan of Care: Client to return for weekly therapy with Zoila Shutter, therapist, to review again in 6 months. Client to engage in positive self talk and challenging negative internal ruminationsand self talk causing client to be overly anxious and worriedusing CBT, on daily practice. Client to engage in mindfulness: ie body scans each eveneing to help process and discharge emotional distress & recognize emotions. Client to utilize BSP (brainspotting) with therapist to help client heal past traumas/ attachment issues to make way for: healing her need to be needed, regulate her anxiety/reduce depression by decreasing anxietyby 33% in the next 6 months, and help her attachment complications in romantic relationships  Client to prioritize sleep 8+ hours each week night AEB going to bed by 10pm each night.  Pauline Good, LCSW, LCAS, CCTP, CCS-I, BSP

## 2021-02-19 ENCOUNTER — Other Ambulatory Visit: Payer: Self-pay | Admitting: Nurse Practitioner

## 2021-02-19 DIAGNOSIS — Z1231 Encounter for screening mammogram for malignant neoplasm of breast: Secondary | ICD-10-CM

## 2021-02-19 DIAGNOSIS — Z9189 Other specified personal risk factors, not elsewhere classified: Secondary | ICD-10-CM

## 2021-02-27 ENCOUNTER — Ambulatory Visit
Admission: RE | Admit: 2021-02-27 | Discharge: 2021-02-27 | Disposition: A | Discharge status: Home / Self Care | Payer: BLUE CROSS/BLUE SHIELD | Source: Ambulatory Visit | Attending: Nurse Practitioner | Admitting: Nurse Practitioner

## 2021-02-27 ENCOUNTER — Other Ambulatory Visit: Payer: Self-pay

## 2021-02-27 DIAGNOSIS — Z9189 Other specified personal risk factors, not elsewhere classified: Secondary | ICD-10-CM

## 2021-02-27 MED ORDER — GADOBUTROL 1 MMOL/ML IV SOLN
7.0000 mL | Freq: Once | INTRAVENOUS | Status: AC | PRN
  Administered 2021-02-27: 7 mL via INTRAVENOUS

## 2021-03-05 ENCOUNTER — Ambulatory Visit
Admission: RE | Admit: 2021-03-05 | Discharge: 2021-03-05 | Disposition: A | Discharge status: Home / Self Care | Payer: BLUE CROSS/BLUE SHIELD | Source: Ambulatory Visit | Attending: Nurse Practitioner | Admitting: Nurse Practitioner

## 2021-03-05 ENCOUNTER — Other Ambulatory Visit: Payer: Self-pay

## 2021-03-05 DIAGNOSIS — Z1231 Encounter for screening mammogram for malignant neoplasm of breast: Secondary | ICD-10-CM

## 2021-03-14 ENCOUNTER — Other Ambulatory Visit: Payer: Self-pay

## 2021-03-14 ENCOUNTER — Ambulatory Visit (INDEPENDENT_AMBULATORY_CARE_PROVIDER_SITE_OTHER): Payer: BC Managed Care – PPO | Admitting: Obstetrics & Gynecology

## 2021-03-14 ENCOUNTER — Encounter: Payer: Self-pay | Admitting: Obstetrics & Gynecology

## 2021-03-14 VITALS — BP 146/97 | HR 76 | Ht 66.0 in | Wt 151.5 lb

## 2021-03-14 DIAGNOSIS — N951 Menopausal and female climacteric states: Secondary | ICD-10-CM

## 2021-03-14 DIAGNOSIS — Z01419 Encounter for gynecological examination (general) (routine) without abnormal findings: Secondary | ICD-10-CM | POA: Diagnosis not present

## 2021-03-14 MED ORDER — FLUCONAZOLE 150 MG PO TABS: ORAL_TABLET | ORAL | 1 refills | Status: DC

## 2021-03-14 MED ORDER — ESTRADIOL 0.1 MG/24HR TD PTTW: 1.0000 | MEDICATED_PATCH | TRANSDERMAL | 12 refills | Status: DC

## 2021-03-14 NOTE — Progress Notes (Signed)
Chief Complaint  Patient presents with   pap only    Was referred for pap but has had a hyst      45 y.o. [id number] No LMP recorded. Patient has had a hysterectomy. The current method of family planning is status post hysterectomy.  Outpatient Encounter Medications as of 03/14/2021  Medication Sig   acetaminophen (TYLENOL) 500 MG tablet Take 1,000 mg by mouth every 6 (six) hours as needed (for pain.).    busPIRone (BUSPAR) 10 MG tablet TAKE 1 TABLET BY MOUTH THREE TIMES A DAY (Patient taking differently: as needed.)   clonazePAM (KLONOPIN) 0.5 MG tablet Take 1 tablet (0.5 mg total) by mouth at bedtime. (Patient taking differently: Take 0.5 mg by mouth as needed.)   estradiol (VIVELLE-DOT) 0.1 MG/24HR patch Place 1 patch (0.1 mg total) onto the skin 2 (two) times a week.   fluconazole (DIFLUCAN) 150 MG tablet Take 1 tablet today and repeat in 3 days   fluticasone (FLONASE) 50 MCG/ACT nasal spray Place 1 spray into both nostrils as needed for allergies or rhinitis.   ibuprofen (ADVIL,MOTRIN) 200 MG tablet Take 400-600 mg by mouth every 8 (eight) hours as needed (for pain.).   Levocetirizine Dihydrochloride (XYZAL PO) Take by mouth as needed.   [DISCONTINUED] buPROPion (WELLBUTRIN SR) 100 MG 12 hr tablet TAKE 1 TABLET BY MOUTH TWICE A DAY   [DISCONTINUED] dexlansoprazole (DEXILANT) 60 MG capsule Take 1 capsule (60 mg total) by mouth daily.   [DISCONTINUED] diazepam (VALIUM) 5 MG tablet    [DISCONTINUED] hydrochlorothiazide (HYDRODIURIL) 25 MG tablet Take 25 mg by mouth daily.    [DISCONTINUED] meloxicam (MOBIC) 15 MG tablet Take 15 mg by mouth daily.   [DISCONTINUED] Probiotic Product (PROBIOTIC DAILY PO) Take 1 capsule by mouth daily.    [DISCONTINUED] ranitidine (ZANTAC) 150 MG tablet Take 150 mg by mouth as needed for heartburn.   No facility-administered encounter medications on file as of 03/14/2021.    Subjective Pt here for pelvic exam and also having increase in vasomotor  symtoms, night sweats, hot flushes, decreased libido Past Medical History:  Diagnosis Date   Hypertension     Past Surgical History:  Procedure Laterality Date   ABDOMINAL HYSTERECTOMY     BREAST BIOPSY Right    20 yrs ago   BREAST EXCISIONAL BIOPSY Right 2020   CESAREAN SECTION     ESOPHAGOGASTRODUODENOSCOPY N/A 03/12/2016   Procedure: ESOPHAGOGASTRODUODENOSCOPY (EGD);  Surgeon: Daneil Dolin, MD;  Location: [facility];  Service: Endoscopy;  Laterality: N/A;  915   PARTIAL HYSTERECTOMY     TUBAL LIGATION      OB History     Gravida  2   Para  2   Term  1   Preterm  1   AB      Living  2      SAB      IAB      Ectopic      Multiple      Live Births  2           Allergies  Allergen Reactions   Penicillins Other (See Comments)    Childhood reaction Has patient had a PCN reaction causing immediate rash, facial/tongue/throat swelling, SOB or lightheadedness with hypotension:unsure Has patient had a PCN reaction causing severe rash involving mucus membranes or skin necrosis:unsure Has patient had a PCN reaction that required hospitalization:doesn't think so Has patient had a PCN reaction occurring within the last 10 years:No  If all of the above answers are "NO", then may proceed with Cephalosporin use.    Lamictal [Lamotrigine] Rash    Social History   Socioeconomic History   Marital status: Divorced    Spouse name: Not on file   Number of children: Not on file   Years of education: Not on file   Highest education level: Not on file  Occupational History   Not on file  Tobacco Use   Smoking status: Former    Packs/day: 0.50    Types: Cigarettes   Smokeless tobacco: Never  Vaping Use   Vaping Use: Some days  Substance and Sexual Activity   Alcohol use: Yes    Comment: socially   Drug use: No   Sexual activity: Yes    Birth control/protection: Surgical    Comment: hyst  Other Topics Concern   Not on file  Social History Narrative    Not on file   Social Determinants of Health   Financial Resource Strain: Low Risk    Difficulty of Paying Living Expenses: Not hard at all  Food Insecurity: No Food Insecurity   Worried About Charity fundraiser in the Last Year: Never true   [place] in the Last Year: Never true  Transportation Needs: No Transportation Needs   Lack of Transportation (Medical): No   Lack of Transportation (Non-Medical): No  Physical Activity: Insufficiently Active   Days of Exercise per Week: 1 day   Minutes of Exercise per Session: 20 min  Stress: Stress Concern Present   Feeling of Stress : To some extent  Social Connections: Moderately Integrated   Frequency of Communication with Friends and Family: More than three times a week   Frequency of Social Gatherings with Friends and Family: More than three times a week   Attends Religious Services: More than 4 times per year   Active Member of [employer] or Organizations: Yes   Attends Music therapist: More than 4 times per year   Marital Status: Divorced    Family History  Problem Relation Age of Onset   Stroke Paternal Grandmother    Breast cancer Maternal Grandmother    Hypertension Father    Breast cancer Mother 89   Hypertension Mother    Breast cancer Maternal Aunt    Colon cancer Neg Hx     Medications:       Current Outpatient Medications:    acetaminophen (TYLENOL) 500 MG tablet, Take 1,000 mg by mouth every 6 (six) hours as needed (for pain.). , Disp: , Rfl:    busPIRone (BUSPAR) 10 MG tablet, TAKE 1 TABLET BY MOUTH THREE TIMES A DAY (Patient taking differently: as needed.), Disp: 270 tablet, Rfl: 1   clonazePAM (KLONOPIN) 0.5 MG tablet, Take 1 tablet (0.5 mg total) by mouth at bedtime. (Patient taking differently: Take 0.5 mg by mouth as needed.), Disp: 30 tablet, Rfl: 2   estradiol (VIVELLE-DOT) 0.1 MG/24HR patch, Place 1 patch (0.1 mg total) onto the skin 2 (two) times a week., Disp: 8 patch, Rfl: 12   fluconazole  (DIFLUCAN) 150 MG tablet, Take 1 tablet today and repeat in 3 days, Disp: 2 tablet, Rfl: 1   fluticasone (FLONASE) 50 MCG/ACT nasal spray, Place 1 spray into both nostrils as needed for allergies or rhinitis., Disp: , Rfl:    ibuprofen (ADVIL,MOTRIN) 200 MG tablet, Take 400-600 mg by mouth every 8 (eight) hours as needed (for pain.)., Disp: , Rfl:    Levocetirizine Dihydrochloride (  XYZAL PO), Take by mouth as needed., Disp: , Rfl:   Objective Blood pressure (!) 146/97, pulse 76, height 5\' 6"  (1.676 m), weight 151 lb 8 oz (68.7 kg).  General WDWN female NAD Abd is benign non tender no masses Vulva:  normal appearing vulva with no masses, tenderness or lesions Vagina:  normal mucosa, no discharge, +yeast Cervix:  Normal no lesions Uterus:  normal size, contour, position, consistency, mobility, non-tender Adnexa: ovaries:present,  normal adnexa in size, nontender and no masses   Pertinent ROS No burning with urination, frequency or urgency No nausea, vomiting or diarrhea Nor fever chills or other constitutional symptoms   Labs or studies     Impression Diagnoses this Encounter::   ICD-10-CM   1. Well woman exam with routine gynecological exam  Z01.419     2. Menopausal symptoms  N95.1    begin vivelle 0.1 mg      Established relevant diagnosis(es):   Plan/Recommendations: Meds ordered this encounter  Medications   estradiol (VIVELLE-DOT) 0.1 MG/24HR patch    Sig: Place 1 patch (0.1 mg total) onto the skin 2 (two) times a week.    Dispense:  8 patch    Refill:  12   fluconazole (DIFLUCAN) 150 MG tablet    Sig: Take 1 tablet today and repeat in 3 days    Dispense:  2 tablet    Refill:  1    Labs or Scans Ordered: No orders of the defined types were placed in this encounter.   Management:: Begin vivelle dot  Follow up No follow-ups on file.      All questions were answered.

## 2021-04-04 ENCOUNTER — Other Ambulatory Visit: Payer: Self-pay | Admitting: Obstetrics & Gynecology

## 2021-05-18 IMAGING — MG DIGITAL SCREENING BILAT W/ TOMO W/ CAD
8 series · 9 of 24 positions shown · non-contrast
Comparison: Previous exam(s).

CLINICAL DATA: Screening. Strong family history

EXAM:
DIGITAL SCREENING BILATERAL MAMMOGRAM WITH TOMO AND CAD

[R CC synth-2D]
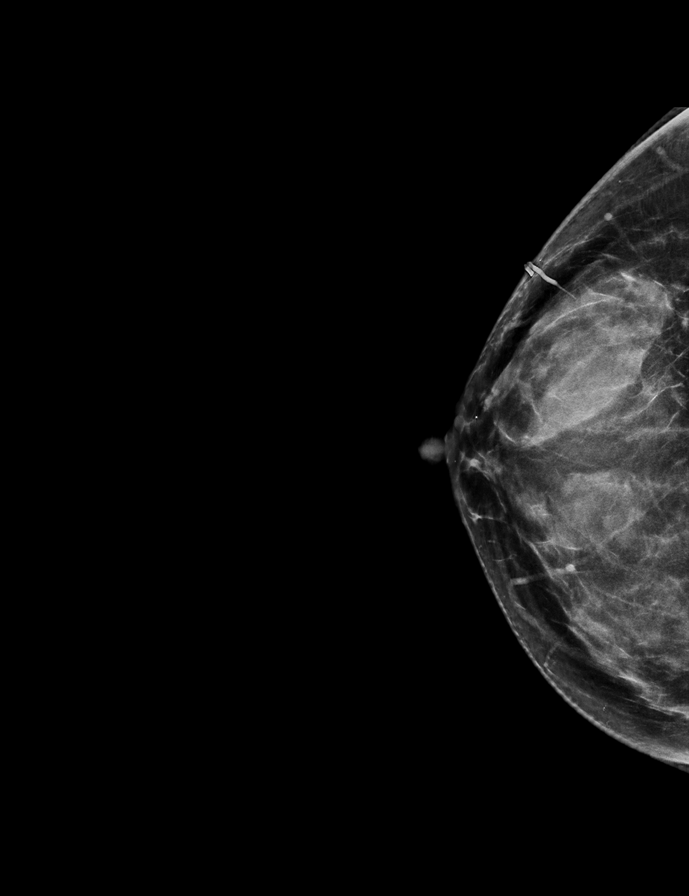

[R MLO synth-2D]
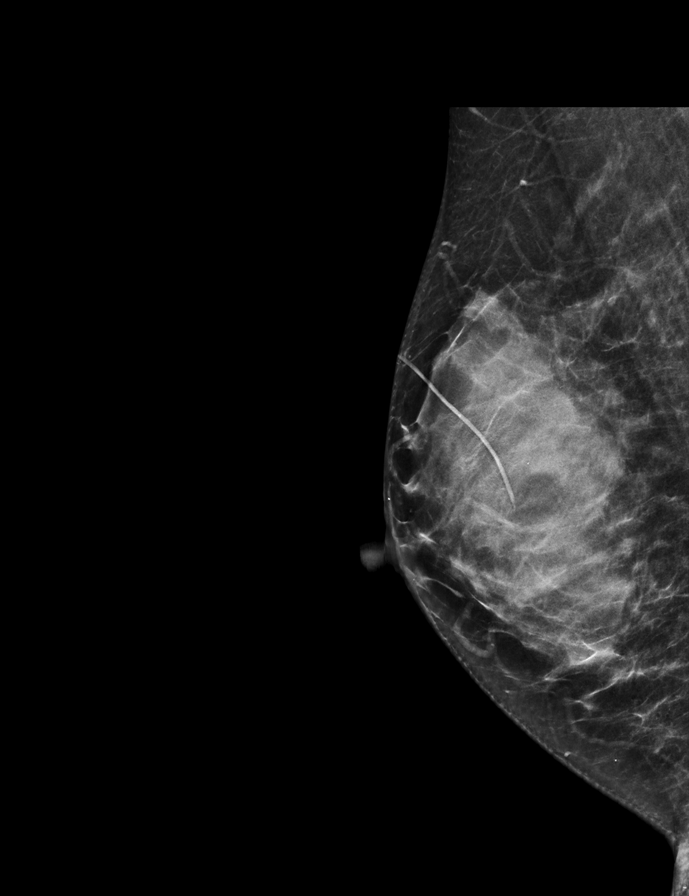

[L CC synth-2D]
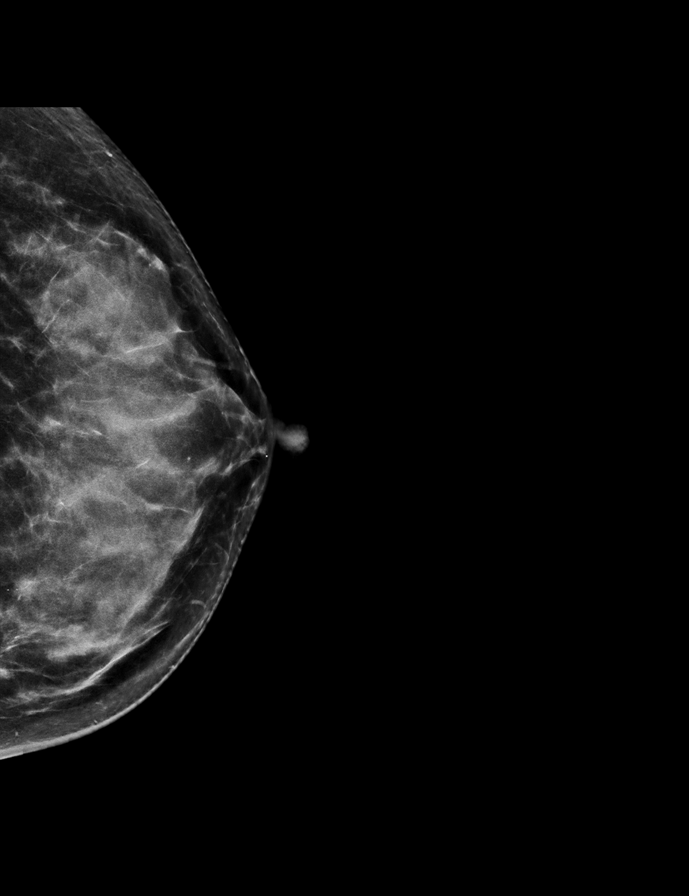

[L MLO synth-2D]
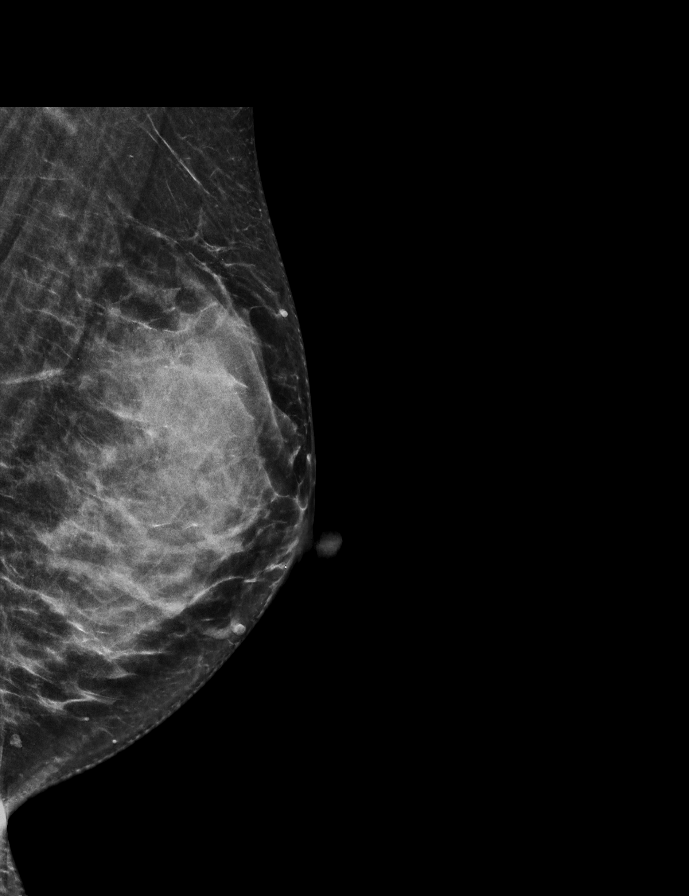

[R CC tomo · 2 of 64 frames shown]
[frame 21/64]
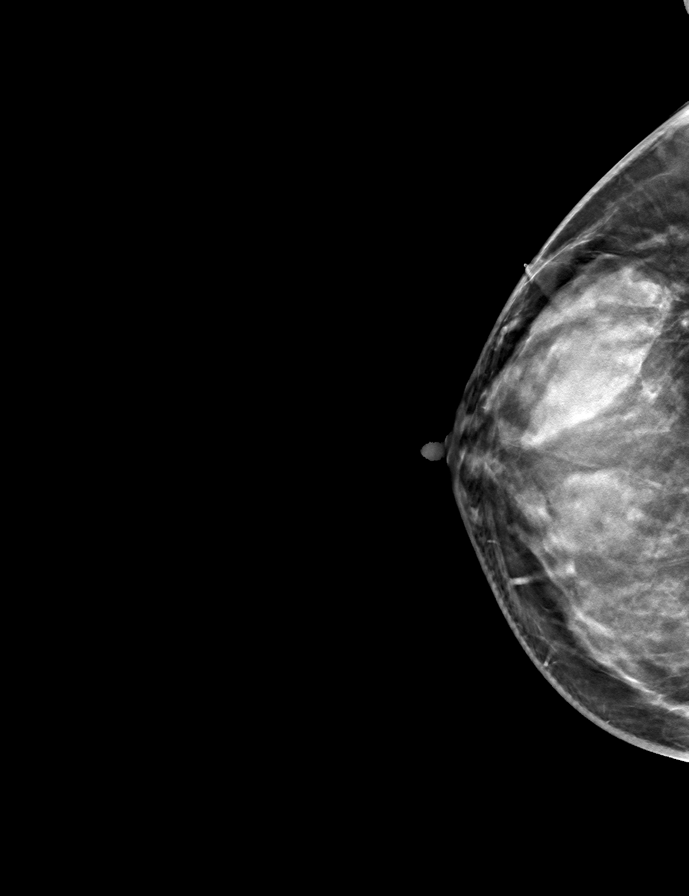
[frame 33/64]
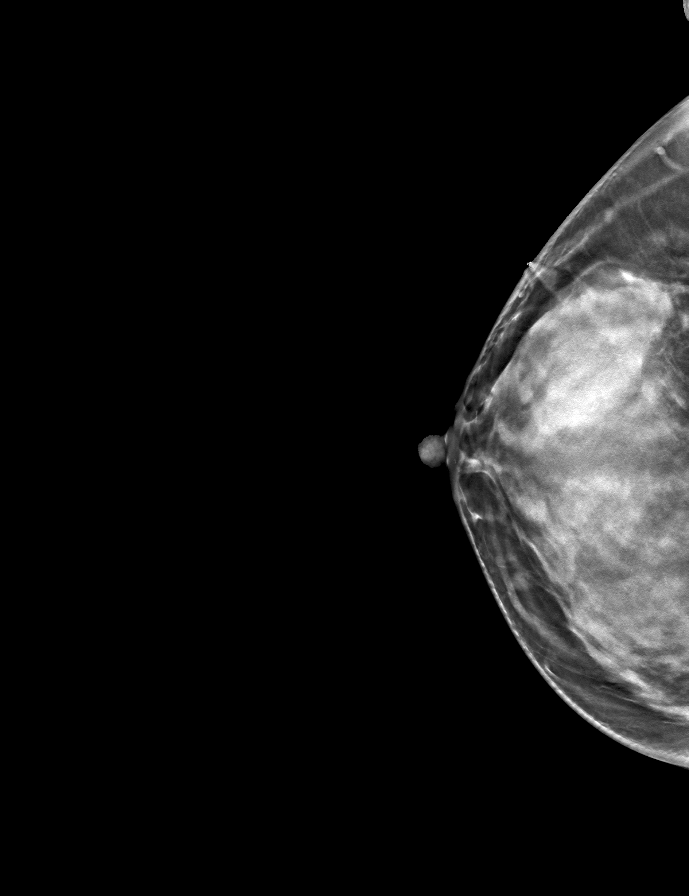

[R MLO tomo · tomo slice 31/61.0]
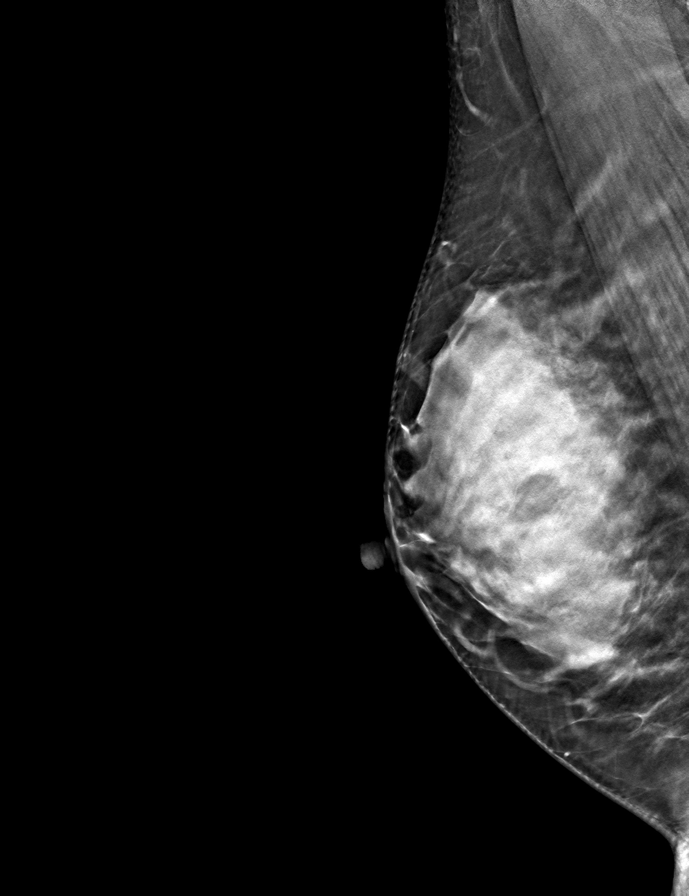

[L MLO tomo · tomo slice 31/62.0]
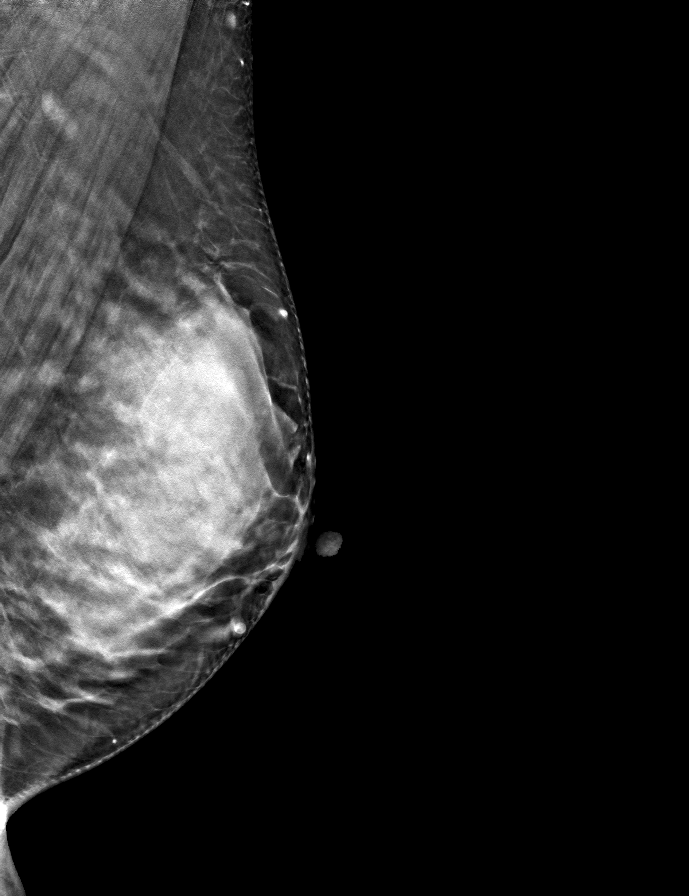

[L CC tomo · tomo slice 32/63.0]
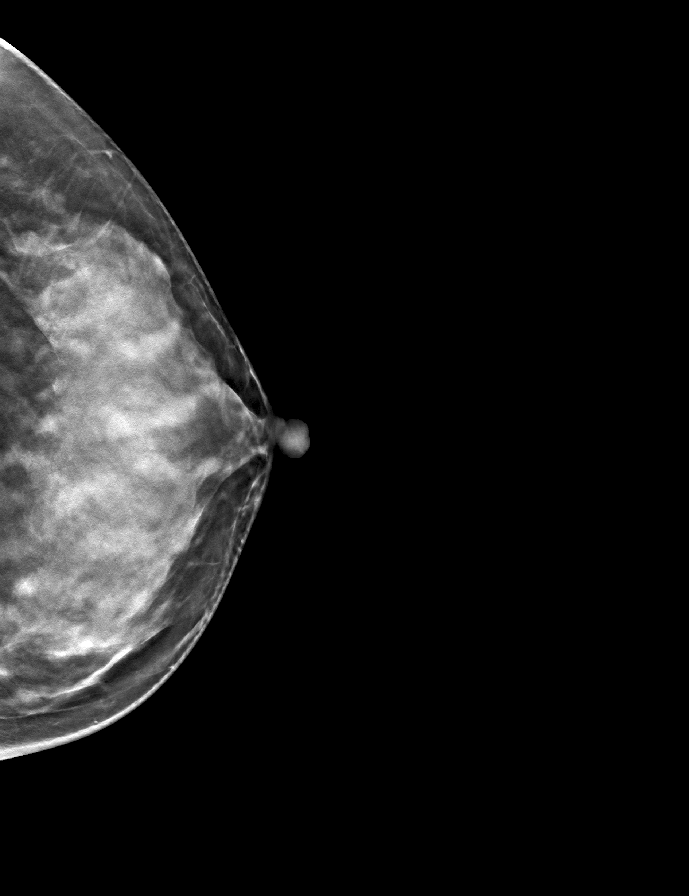

[9 of 24 positions shown; findings below may reference images not displayed]

ACR Breast Density Category d: The breast tissue is extremely dense,
which lowers the sensitivity of mammography.
FINDINGS: In the left breast, a possible asymmetry seen on MLO view warrants
further evaluation. In the right breast, no findings suspicious for
malignancy. There is density and architectural distortion within the
RIGHT breast, consistent with postsurgical changes. These are stable
in comparison to prior. Images were processed with CAD.
IMPRESSION: Further evaluation is suggested for possible asymmetry in the left
breast.

RECOMMENDATION:
Diagnostic mammogram and possibly ultrasound of the left breast.
(Code:[id number])

The patient will be contacted regarding the findings, and additional
imaging will be scheduled.

The American Cancer Society recommends annual MRI and mammography in
patients with an estimated lifetime risk of developing breast cancer
greater than 20 - 25%, or who are known or suspected to be positive
for the breast cancer gene.

BI-RADS CATEGORY  0: Incomplete. Need additional imaging evaluation
and/or prior mammograms for comparison.

## 2021-11-22 ENCOUNTER — Ambulatory Visit: Payer: BC Managed Care – PPO | Admitting: Obstetrics & Gynecology

## 2021-11-22 ENCOUNTER — Encounter: Payer: Self-pay | Admitting: Obstetrics & Gynecology

## 2021-11-22 VITALS — BP 152/92 | HR 84 | Ht 66.0 in | Wt 159.0 lb

## 2021-11-22 DIAGNOSIS — N838 Other noninflammatory disorders of ovary, fallopian tube and broad ligament: Secondary | ICD-10-CM | POA: Diagnosis not present

## 2021-11-22 DIAGNOSIS — N951 Menopausal and female climacteric states: Secondary | ICD-10-CM | POA: Diagnosis not present

## 2021-11-22 MED ORDER — ESTRADIOL 1 MG PO TABS: 1.0000 mg | ORAL_TABLET | Freq: Every day | ORAL | 11 refills | Status: DC

## 2021-11-22 NOTE — Progress Notes (Signed)
Follow up appointment for results: sonogram  Chief Complaint  Patient presents with   Pelvic Pain    Had ultrasound at [facility]    Blood pressure (!) 152/92, pulse 84, height '5\' 6"'$  (1.676 m), weight 159 lb (72.1 kg).   CLINICAL DATA:  pelvic and perinal pain   EXAM:  TRANSABDOMINAL AND TRANSVAGINAL ULTRASOUND OF PELVIS   DOPPLER ULTRASOUND OF OVARIES   TECHNIQUE:  Both transabdominal and transvaginal ultrasound examinations of the  pelvis were performed. Transabdominal technique was performed for  global imaging of the pelvis including uterus, ovaries, adnexal  regions, and pelvic cul-de-sac.   It was necessary to proceed with endovaginal exam following the  transabdominal exam to visualize the bilateral ovaries and adnexa.  Color and duplex Doppler ultrasound was utilized to evaluate blood  flow to the ovaries.   COMPARISON:  April 24, 2010   FINDINGS:  Uterus   There has been interval hysterectomy   Endometrium   There has been interval hysterectomy   Right ovary   Measurements: 3.5 x 2.4 x 3.1 cm = volume: 13.9 mL. There is 1.5 x  1.5 x 1.5 cm complex echogenic area seen within the ovary.   Left ovary   Measurements: 3.4 x 1.9 x 3.2 cm = volume: 10.5 mL. There is 1.7 x  1.5 x 1.3 cm complex echogenic area with calcifications within the  left ovary   Pulsed Doppler evaluation of both ovaries demonstrates normal  low-resistance arterial and venous waveforms.   Other findings   Small to moderate fluid in the dependent pelvis Procedure Note  Sung Amabile, MD - 11/18/2021  Formatting of this note might be different from the original.  CLINICAL DATA:  pelvic and perinal pain   EXAM:  TRANSABDOMINAL AND TRANSVAGINAL ULTRASOUND OF PELVIS   DOPPLER ULTRASOUND OF OVARIES   TECHNIQUE:  Both transabdominal and transvaginal ultrasound examinations of the  pelvis were performed. Transabdominal technique was performed for  global imaging of the  pelvis including uterus, ovaries, adnexal  regions, and pelvic cul-de-sac.   It was necessary to proceed with endovaginal exam following the  transabdominal exam to visualize the bilateral ovaries and adnexa.  Color and duplex Doppler ultrasound was utilized to evaluate blood  flow to the ovaries.   COMPARISON:  April 24, 2010   FINDINGS:  Uterus   There has been interval hysterectomy   Endometrium   There has been interval hysterectomy   Right ovary   Measurements: 3.5 x 2.4 x 3.1 cm = volume: 13.9 mL. There is 1.5 x  1.5 x 1.5 cm complex echogenic area seen within the ovary.   Left ovary   Measurements: 3.4 x 1.9 x 3.2 cm = volume: 10.5 mL. There is 1.7 x  1.5 x 1.3 cm complex echogenic area with calcifications within the  left ovary   Pulsed Doppler evaluation of both ovaries demonstrates normal  low-resistance arterial and venous waveforms.   Other findings   Small to moderate fluid in the dependent pelvis   IMPRESSION:  1.  Status post hysterectomy.   2. There is complex echogenic area with some calcifications seen in  the left ovary measuring 1.7 x 1.5 x 1.3 cm and there is a complex  echogenic area measuring 1.5 x 1.5 x 1.5 cm in the right ovary.   Suggest 3 to 6 month follow-up study for further evaluation.   Small to moderate fluid in the dependent pelvis, PID cannot be  entirely excluded. Clinical correlation is  suggested.    Electronically Signed    By: Frazier Richards M.D.    On: 11/18/2021 09:28 Exam End: 11/15/21 09:21   Specimen Collected: 11/18/21 09:15 Last Resulted: 11/18/21 09:28  Received From: [facility]  Result Received: 11/22/21 09:12    MEDS ordered this encounter: Meds ordered this encounter  Medications   estradiol (ESTRACE) 1 MG tablet    Sig: Take 1 tablet (1 mg total) by mouth daily.    Dispense:  30 tablet    Refill:  11    Orders for this encounter: Orders Placed This Encounter  Procedures   Ovarian Malignancy  Risk-ROMA    Impression + Management Plan   ICD-10-CM   1. Ovarian mass  N83.8 Ovarian Malignancy Risk-ROMA   appears to be bilateral small dermoids(calcified) will check markers    2. Vasomotor symptoms due to menopause  N95.1    trial estrace '1mg'$       Follow Up: Return in about 6 months (around 05/25/2022) for GYN sono, Follow up, with Dr Elonda Husky.     All questions were answered.  Past Medical History:  Diagnosis Date   Hypertension     Past Surgical History:  Procedure Laterality Date   ABDOMINAL HYSTERECTOMY     BREAST BIOPSY Right    20 yrs ago   BREAST EXCISIONAL BIOPSY Right 2020   CESAREAN SECTION     ESOPHAGOGASTRODUODENOSCOPY N/A 03/12/2016   Procedure: ESOPHAGOGASTRODUODENOSCOPY (EGD);  Surgeon: Daneil Dolin, MD;  Location: [facility];  Service: Endoscopy;  Laterality: N/A;  915   PARTIAL HYSTERECTOMY     TUBAL LIGATION      OB History     Gravida  2   Para  2   Term  1   Preterm  1   AB      Living  2      SAB      IAB      Ectopic      Multiple      Live Births  2           Allergies  Allergen Reactions   Penicillins Other (See Comments)    Childhood reaction Has patient had a PCN reaction causing immediate rash, facial/tongue/throat swelling, SOB or lightheadedness with hypotension:unsure Has patient had a PCN reaction causing severe rash involving mucus membranes or skin necrosis:unsure Has patient had a PCN reaction that required hospitalization:doesn't think so Has patient had a PCN reaction occurring within the last 10 years:No If all of the above answers are "NO", then may proceed with Cephalosporin use.    Lamictal [Lamotrigine] Rash    Social History   Socioeconomic History   Marital status: Divorced    Spouse name: Not on file   Number of children: Not on file   Years of education: Not on file   Highest education level: Not on file  Occupational History   Not on file  Tobacco Use   Smoking status:  Former    Packs/day: 0.50    Types: Cigarettes   Smokeless tobacco: Never  Vaping Use   Vaping Use: Some days  Substance and Sexual Activity   Alcohol use: Yes    Comment: socially   Drug use: No   Sexual activity: Yes    Birth control/protection: Surgical    Comment: hyst  Other Topics Concern   Not on file  Social History Narrative   Not on file   Social Determinants of Health   Financial  Resource Strain: Low Risk  (03/14/2021)   Overall Financial Resource Strain (CARDIA)    Difficulty of Paying Living Expenses: Not hard at all  Food Insecurity: No Food Insecurity (03/14/2021)   Hunger Vital Sign    Worried About Running Out of Food in the Last Year: Never true    Ran Out of Food in the Last Year: Never true  Transportation Needs: No Transportation Needs (03/14/2021)   PRAPARE - Hydrologist (Medical): No    Lack of Transportation (Non-Medical): No  Physical Activity: Insufficiently Active (03/14/2021)   Exercise Vital Sign    Days of Exercise per Week: 1 day    Minutes of Exercise per Session: 20 min  Stress: Stress Concern Present (03/14/2021)   [place]    Feeling of Stress : To some extent  Social Connections: Moderately Integrated (03/14/2021)   Social Connection and Isolation Panel [NHANES]    Frequency of Communication with Friends and Family: More than three times a week    Frequency of Social Gatherings with Friends and Family: More than three times a week    Attends Religious Services: More than 4 times per year    Active Member of [employer] or Organizations: Yes    Attends Music therapist: More than 4 times per year    Marital Status: Divorced    Family History  Problem Relation Age of Onset   Stroke Paternal Grandmother    Breast cancer Maternal Grandmother    Hypertension Father    Breast cancer Mother 31   Hypertension Mother    Breast cancer  Maternal Aunt    Colon cancer Neg Hx

## 2021-11-23 LAB — OVARIAN MALIGNANCY RISK-ROMA
Cancer Antigen (CA) 125: 10.6 U/mL (ref 0.0–38.1)
HE4: 49.6 pmol/L (ref 0.0–63.6)
Postmenopausal ROMA: 0.91
Premenopausal ROMA: 0.72

## 2021-11-23 LAB — PREMENOPAUSAL INTERP: LOW

## 2021-11-23 LAB — POSTMENOPAUSAL INTERP: LOW

## 2022-03-03 ENCOUNTER — Other Ambulatory Visit: Payer: Self-pay | Admitting: Nurse Practitioner

## 2022-03-03 DIAGNOSIS — Z1231 Encounter for screening mammogram for malignant neoplasm of breast: Secondary | ICD-10-CM

## 2022-04-23 ENCOUNTER — Ambulatory Visit
Admission: RE | Admit: 2022-04-23 | Discharge: 2022-04-23 | Disposition: A | Discharge status: Home / Self Care | Payer: BC Managed Care – PPO | Source: Ambulatory Visit | Attending: Nurse Practitioner | Admitting: Nurse Practitioner

## 2022-04-23 DIAGNOSIS — Z1231 Encounter for screening mammogram for malignant neoplasm of breast: Secondary | ICD-10-CM

## 2022-05-28 ENCOUNTER — Other Ambulatory Visit: Payer: Self-pay | Admitting: Obstetrics & Gynecology

## 2022-05-28 DIAGNOSIS — N838 Other noninflammatory disorders of ovary, fallopian tube and broad ligament: Secondary | ICD-10-CM

## 2022-05-30 ENCOUNTER — Ambulatory Visit: Payer: BC Managed Care – PPO | Admitting: Obstetrics & Gynecology

## 2022-05-30 ENCOUNTER — Other Ambulatory Visit: Payer: Self-pay | Admitting: Obstetrics & Gynecology

## 2022-05-30 ENCOUNTER — Ambulatory Visit (INDEPENDENT_AMBULATORY_CARE_PROVIDER_SITE_OTHER): Payer: BC Managed Care – PPO

## 2022-05-30 ENCOUNTER — Encounter: Payer: Self-pay | Admitting: Obstetrics & Gynecology

## 2022-05-30 DIAGNOSIS — N838 Other noninflammatory disorders of ovary, fallopian tube and broad ligament: Secondary | ICD-10-CM

## 2022-05-30 DIAGNOSIS — N951 Menopausal and female climacteric states: Secondary | ICD-10-CM | POA: Diagnosis not present

## 2022-05-30 MED ORDER — ESTRADIOL 2 MG PO TABS: 2.0000 mg | ORAL_TABLET | Freq: Every day | ORAL | 11 refills | Status: DC

## 2022-05-30 NOTE — Progress Notes (Addendum)
PELVIC US TA/TV: normal vaginal cuff,normal left ovary,simple tube like structure adjacent to left ovary 3.2 x .8 x 2.5 cm,three right ovarian cysts (#1) simple unilocular right ovarian cyst  3.2 x 2.2 x 2.7 cm,(#2) complex cyst with a single septation 3.3 x 2.4 x 3.7 cm,(#3) ? Hemorrhage cyst with through transmission,avascular 2.8 x 2.4 x  2.3 cm,no free fluid,right adnexal pain during ultrasound  Chaperone Keyla

## 2022-05-30 NOTE — Progress Notes (Signed)
Follow up appointment for results: Ovarian cysts  Chief Complaint  Patient presents with   Follow-up    [place] today    Blood pressure (!) 170/92, pulse 72, height  (1.676 m), weight 164 lb (74.4 kg).      [facility] ordered this encounter:      GYNECOLOGIC SONOGRAM     Melanie Cochran is a 47 y.o. [id number] No LMP recorded. Patient has had a hysterectomy. for a pelvic sonogram for pelvic pain,? Bilateral dermoids seen on prior outside ultrasound.   Uterus                    surgically removed,normal vagina cuff   Endometrium        n/a   Right ovary             6 x 2.1 x 5.2 cm, three right ovarian cysts (#1) simple unilocular right ovarian cyst  3.2 x 2.2 x 2.7 cm,(#2) complex cyst with a single septation 3.3 x 2.4 x 3.7 cm,(#3) ? Hemorrhage cyst with through transmission,avascular 2.8 x 2.4 x  2.3 cm   Left ovary                2.2 x 2.1 x 1.1 cm, normal left ovary,simple tube like structure adjacent to left ovary 3.2 x .8 x 2.5 cm   No free fluid    Technician Comments:   PELVIC US TA/TV: normal vaginal cuff,normal left ovary,simple tube like structure adjacent to left ovary 3.2 x .8 x 2.5 cm,three right ovarian cysts (#1) simple unilocular right ovarian cyst  3.2 x 2.2 x 2.7 cm,(#2) complex cyst with a single septation 3.3 x 2.4 x 3.7 cm,(#3) ? Hemorrhage cyst with through transmission,avascular 2.8 x 2.4 x  2.3 cm,no free fluid,right adnexal pain during ultrasound   Chaperone [address] Flora Lipps 06/02/2022 9:52 AM     Clinical Impression and recommendations: Comparison to scan 11/2021, no images   I have reviewed the sonogram results above, combined with the patient's current clinical course, below are my impressions and any appropriate recommendations for management based on the sonographic findings.   Uterus absent Endometrium absent Ovaries: both ovaries with cysts, right are larger and that represents the area of pain as well, significant change in interval since  previous scan     Lazaro Arms 06/02/2022 4:25 PM     Orders for this encounter: No orders of the defined types were placed in this encounter.   Impression + Management Plan   ICD-10-CM   1. Ovarian mass  N83.8 US Transvaginal Non-OB    CANCELED: US PELVIC COMPLETE WITH TRANSVAGINAL    2. Vasomotor symptoms due to menopause  N95.1 estradiol (ESTRACE) 2 MG tablet   trial estrace      Robotic BSO 07/09/22  Follow Up: No follow-ups on file.     All questions were answered.  Past Medical History:  Diagnosis Date   Hypertension     Past Surgical History:  Procedure Laterality Date   ABDOMINAL HYSTERECTOMY     BREAST BIOPSY Right    20 yrs ago   BREAST EXCISIONAL BIOPSY Right 2020   CESAREAN SECTION     ESOPHAGOGASTRODUODENOSCOPY N/A 03/12/2016   Procedure: ESOPHAGOGASTRODUODENOSCOPY (EGD);  Surgeon: Corbin Ade, MD;  Location: [facility];  Service: Endoscopy;  Laterality: N/A;  915   PARTIAL HYSTERECTOMY     TUBAL LIGATION      OB  History     Gravida  2   Para  2   Term  1   Preterm  1   AB      Living  2      SAB      IAB      Ectopic      Multiple      Live Births  2           Allergies  Allergen Reactions   Penicillins Other (See Comments)    Childhood reaction Has patient had a PCN reaction causing immediate rash, facial/tongue/throat swelling, SOB or lightheadedness with hypotension:unsure Has patient had a PCN reaction causing severe rash involving mucus membranes or skin necrosis:unsure Has patient had a PCN reaction that required hospitalization:doesn't think so Has patient had a PCN reaction occurring within the last 10 years:No If all of the above answers are "NO", then may proceed with Cephalosporin use.    Lamictal [Lamotrigine] Rash    Social History   Socioeconomic History   Marital status: Divorced    Spouse name: Not on file   Number of children: Not on file   Years of education: Not on file    Highest education level: Not on file  Occupational History   Not on file  Tobacco Use   Smoking status: Former    Packs/day: 0.50    Types: Cigarettes   Smokeless tobacco: Never  Vaping Use   Vaping Use: Some days  Substance and Sexual Activity   Alcohol use: Yes    Comment: socially   Drug use: No   Sexual activity: Yes    Birth control/protection: Surgical    Comment: hyst  Other Topics Concern   Not on file  Social History Narrative   Not on file   Social Determinants of Health   Financial Resource Strain: Low Risk  (03/14/2021)   Overall Financial Resource Strain (CARDIA)    Difficulty of Paying Living Expenses: Not hard at all  Food Insecurity: No Food Insecurity (03/14/2021)   Hunger Vital Sign    Worried About Running Out of Food in the Last Year: Never true    Ran Out of Food in the Last Year: Never true  Transportation Needs: No Transportation Needs (03/14/2021)   PRAPARE - Administrator, Civil Service (Medical): No    Lack of Transportation (Non-Medical): No  Physical Activity: Insufficiently Active (03/14/2021)   Exercise Vital Sign    Days of Exercise per Week: 1 day    Minutes of Exercise per Session: 20 min  Stress: Stress Concern Present (03/14/2021)   [employer] of Occupational Health - Occupational Stress Questionnaire    Feeling of Stress : To some extent  Social Connections: Moderately Integrated (03/14/2021)   Social Connection and Isolation Panel [NHANES]    Frequency of Communication with Friends and Family: More than three times a week    Frequency of Social Gatherings with Friends and Family: More than three times a week    Attends Religious Services: More than 4 times per year    Active Member of [employer] or Organizations: Yes    Attends Engineer, structural: More than 4 times per year    Marital Status: Divorced    Family History  Problem Relation Age of Onset   Stroke Paternal Grandmother    Breast cancer Maternal  Grandmother    Hypertension Father    Breast cancer Mother 62   Hypertension Mother    Breast  cancer Maternal Aunt    Colon cancer Neg Hx

## 2022-06-02 ENCOUNTER — Encounter: Payer: Self-pay | Admitting: Obstetrics & Gynecology

## 2022-06-05 ENCOUNTER — Encounter: Payer: Self-pay | Admitting: Obstetrics & Gynecology

## 2022-06-05 MED ORDER — LOSARTAN POTASSIUM-HCTZ 50-12.5 MG PO TABS: 1.0000 | ORAL_TABLET | Freq: Every day | ORAL | 3 refills | Status: DC

## 2022-06-24 ENCOUNTER — Other Ambulatory Visit: Payer: Self-pay | Admitting: Obstetrics & Gynecology

## 2022-06-24 DIAGNOSIS — N951 Menopausal and female climacteric states: Secondary | ICD-10-CM

## 2022-06-27 NOTE — Patient Instructions (Signed)
Kache Smyer [facility]  06/27/2022     @PREFPERIOPPHARMACY$ @   Your procedure is scheduled on 07/02/2022.  Report to [facility] at 7: 10 AM  Call this number if you have problems the morning of surgery:  [phone]  If you experience any cold or flu symptoms such as cough, fever, chills, shortness of breath, etc. between now and your scheduled surgery, please notify us at the above number.   Remember:  Do not eat or drink after midnight.     Take these medicines the morning of surgery with A SIP OF WATER : Baclofen Buspar zyrtec Klonopin Meloxicam    Do not wear jewelry, make-up or nail polish.  Do not wear lotions, powders, or perfumes, or deodorant.  Do not shave 48 hours prior to surgery.  Men may shave face and neck.  Do not bring valuables to the hospital.  [facility] is not responsible for any belongings or valuables.  Contacts, dentures or bridgework may not be worn into surgery.  Leave your suitcase in the car.  After surgery it may be brought to your room.  For patients admitted to the hospital, discharge time will be determined by your treatment team.  Patients discharged the day of surgery will not be allowed to drive home.   Name and phone number of your driver:   Family Special instructions:  N/A  Please read over the following fact sheets that you were given. Care and Recovery After Surgery   Bilateral Salpingo-Oophorectomy  Bilateral salpingo-oophorectomy is the surgical removal of both fallopian tubes and both ovaries. The ovaries are reproductive organs that produce eggs in females. The fallopian tubes allow eggs to move from the ovaries to the uterus. You may need this procedure if you: Have had your uterus removed. This procedure is usually done after the uterus is removed. Have cancer of the fallopian tubes or ovaries. Have a high risk of cancer of the fallopian tubes or ovaries. There are three techniques that can be used for this procedure: Open. One  large incision will be made in your abdomen. Laparoscopic. A thin, lighted tube with a small camera (laparoscope) is used to help perform the procedure. The laparoscope allows your surgeon to make several small incisions in the abdomen instead of one large incision. Robot-assisted. A computer is used to control surgical instruments that are attached to robotic arms. A laparoscope may also be used with this technique. After this procedure, you will not be able to become pregnant (will be sterile), and you will go into menopause. Menopause is when you no longer have menstrual periods. Symptoms of menopause can include hot flashes, night sweats, and mood changes. Your sex drive may also be affected. Tell a health care provider about: Any allergies you have. All medicines you are taking, including vitamins, herbs, eye drops, creams, and over-the-counter medicines. Any problems you or family members have had with anesthetic medicines. Any blood disorders you have. Any surgeries you have had. Any medical conditions you have. Whether you are pregnant or may be pregnant. What are the risks? Generally, this is a safe procedure. However, problems may occur, including: Infection. Bleeding. Allergic reactions to medicines. Damage to nearby structures or organs. Blood clots in the legs or lungs. What happens before the procedure? Staying hydrated Follow instructions from your health care provider about hydration, which may include: Up to 2 hours before the procedure - you may continue to drink clear liquids, such as water, clear fruit juice, black coffee,  and plain tea.  Eating and drinking restrictions Follow instructions from your health care provider about eating and drinking, which may include: 8 hours before the procedure - stop eating heavy meals or foods, such as meat, fried foods, or fatty foods. 6 hours before the procedure - stop eating light meals or foods, such as toast or cereal. 6 hours  before the procedure - stop drinking milk or drinks that contain milk. 2 hours before the procedure - stop drinking clear liquids. Medicines Ask your health care provider about: Changing or stopping your regular medicines. This is especially important if you are taking diabetes medicines or blood thinners. Taking medicines such as aspirin and ibuprofen. These medicines can thin your blood. Do not take these medicines unless your health care provider tells you to take them. Taking over-the-counter medicines, vitamins, herbs, and supplements. General instructions Do not use any products that contain nicotine or tobacco for at least 4 weeks before the procedure. These products include cigarettes, chewing tobacco, and vaping devices, such as e-cigarettes. If you need help quitting, ask your health care provider. You may have an exam or tests, such as a blood or urine test. Ask your health care provider: How your surgery site will be marked. What steps will be taken to help prevent infection. These steps may include: Removing hair at the surgery site. Washing skin with a germ-killing soap. Taking antibiotic medicine. Plan to have a responsible adult take you home from the hospital or clinic. If you will be going home right after the procedure, plan to have a responsible adult care for you for the time you are told. This is important. What happens during the procedure? An IV will be inserted into one of your veins. You will be given one or both of the following: A medicine to help you relax (sedative). A medicine to make you fall asleep (general anesthetic). A small, thin tube (catheter) will be inserted through your urethra and into your bladder. The catheter drains urine during your procedure. Depending on the type of surgery you are having, one incision or several small incisions will be made in your abdomen. Your fallopian tubes and ovaries will be cut away from the uterus and removed. Your  blood vessels will be clamped and tied to prevent too much bleeding. The incision or incisions in your abdomen will be closed with stitches (sutures), staples, or skin glue. A bandage (dressing) may be placed over your incision or incisions. The procedure may vary among health care providers and hospitals. What happens after the procedure? Your blood pressure, heart rate, breathing rate, and blood oxygen level will be monitored until you leave the hospital or clinic. You may continue to receive fluids and medicines through an IV. You may continue to have a catheter draining your urine. You may have to wear compression stockings. These stockings help to prevent blood clots and reduce swelling in your legs. You will be given pain medicine as needed. If you were given a sedative during the procedure, it can affect you for several hours. Do not drive or operate machinery until your health care provider says that it is safe. Summary Bilateral salpingo-oophorectomy is a procedure to remove both fallopian tubes and both ovaries. There are three different techniques that can be used for this procedure: open, laparoscopic, and robotic. Talk with your health care provider about how your procedure will be done. After this procedure, you will not be able to become pregnant (will be sterile). You will go  into menopause. This is when you no longer have a menstrual period. Plan to have a responsible adult take you home from the hospital or clinic. This information is not intended to replace advice given to you by your health care provider. Make sure you discuss any questions you have with your health care provider. Document Revised: 03/20/2020 Document Reviewed: 03/20/2020 Elsevier Patient Education  [place] Anesthesia, Adult General anesthesia is the use of medicine to make you fall asleep (unconscious) for a medical procedure. General anesthesia must be used for certain procedures. It is  often recommended for surgery or procedures that: Last a long time. Require you to be still or in an unusual position. Are major and can cause blood loss. Affect your breathing. The medicines used for general anesthesia are called general anesthetics. During general anesthesia, these medicines are given along with medicines that: Prevent pain. Control your blood pressure. Relax your muscles. Prevent nausea and vomiting after the procedure. Tell a health care provider about: Any allergies you have. All medicines you are taking, including vitamins, herbs, eye drops, creams, and over-the-counter medicines. Your history of any: Medical conditions you have, including: High blood pressure. Bleeding problems. Diabetes. Heart or lung conditions, such as: Heart failure. Sleep apnea. Asthma. Chronic obstructive pulmonary disease (COPD). Current or recent illnesses, such as: Upper respiratory, chest, or ear infections. Cough or fever. Tobacco or drug use, including marijuana or alcohol use. Depression or anxiety. Surgeries and types of anesthetics you have had. Problems you or family members have had with anesthetic medicines. Whether you are pregnant or may be pregnant. Whether you have any chipped or loose teeth, dentures, caps, bridgework, or issues with your mouth, swallowing, or choking. What are the risks? Your health care provider will talk with you about risks. These may include: Allergic reaction to the medicines. Lung and heart problems. Inhaling food or liquid from the stomach into the lungs (aspiration). Nerve injury. Injury to the lips, mouth, teeth, or gums. Stroke. Waking up during your procedure and being unable to move. This is rare. These problems are more likely to develop if you are having a major surgery or if you have an advanced or serious medical condition. You can prevent some of these complications by answering all of your health care provider's questions  thoroughly and by following all instructions before your procedure. General anesthesia can cause side effects, including: Nausea or vomiting. A sore throat or hoarseness from the breathing tube. Wheezing or coughing. Shaking chills or feeling cold. Body aches. Sleepiness. Confusion, agitation (delirium), or anxiety. What happens before the procedure? When to stop eating and drinking Follow instructions from your health care provider about what you may eat and drink before your procedure. If you do not follow your health care provider's instructions, your procedure may be delayed or canceled. Medicines Ask your health care provider about: Changing or stopping your regular medicines. These include any diabetes medicines or blood thinners you take. Taking medicines such as aspirin and ibuprofen. These medicines can thin your blood. Do not take them unless your health care provider tells you to. Taking over-the-counter medicines, vitamins, herbs, and supplements. General instructions Do not use any products that contain nicotine or tobacco for at least 4 weeks before the procedure. These products include cigarettes, chewing tobacco, and vaping devices, such as e-cigarettes. If you need help quitting, ask your health care provider. If you brush your teeth on the morning of the procedure, make sure to spit out all  of the water and toothpaste. If told by your health care provider, bring your sleep apnea device with you to surgery (if applicable). If you will be going home right after the procedure, plan to have a responsible adult: Take you home from the hospital or clinic. You will not be allowed to drive. Care for you for the time you are told. What happens during the procedure?  An IV will be inserted into one of your veins. You will be given one or more of the following through a face mask or IV: A sedative. This helps you relax. Anesthesia. This will: Numb certain areas of your  body. Make you fall asleep for surgery. After you are unconscious, a breathing tube may be inserted down your throat to help you breathe. This will be removed before you wake up. An anesthesia provider, such as an anesthesiologist, will stay with you throughout your procedure. The anesthesia provider will: Keep you comfortable and safe by continuing to give you medicines and adjusting the amount of medicine that you get. Monitor your blood pressure, heart rate, and oxygen levels to make sure that the anesthetics do not cause any problems. The procedure may vary among health care providers and hospitals. What happens after the procedure? Your blood pressure, temperature, heart rate, breathing rate, and blood oxygen level will be monitored until you leave the hospital or clinic. You will wake up in a recovery area. You may wake up slowly. You may be given medicine to help you with pain, nausea, or any other side effects from the anesthesia. Summary General anesthesia is the use of medicine to make you fall asleep (unconscious) for a medical procedure. Follow your health care provider's instructions about when to stop eating, drinking, or taking certain medicines before your procedure. Plan to have a responsible adult take you home from the hospital or clinic. This information is not intended to replace advice given to you by your health care provider. Make sure you discuss any questions you have with your health care provider. Document Revised: 07/25/2021 Document Reviewed: 07/25/2021 Elsevier Patient Education  [place] Anesthesia refers to the techniques, procedures, and medicines that help a person stay safe and comfortable during surgery. Monitored anesthesia care, or sedation, is one type of anesthesia. You may have sedation if you do not need to be asleep for your procedure. Procedures that use sedation may include: Surgery to remove cataracts from your  eyes. A dental procedure. A biopsy. This is when a tissue sample is removed and looked at under a microscope. You will be watched closely during your procedure. Your level of sedation or type of anesthesia may be changed to fit your needs. Tell a health care provider about: Any allergies you have. All medicines you are taking, including vitamins, herbs, eye drops, creams, and over-the-counter medicines. Any problems you or family members have had with anesthesia. Any bleeding problems you have. Any surgeries you have had. Any medical conditions or illnesses you have. This includes sleep apnea, cough, fever, or the flu. Whether you are pregnant or may be pregnant. Whether you use cigarettes, alcohol, or drugs. Any use of steroids, whether by mouth or as a cream. What are the risks? Your health care provider will talk with you about risks. These may include: Getting too much medicine (oversedation). Nausea. Allergic reactions to medicines. Trouble breathing. If this happens, a breathing tube may be used to help you breathe. It will be removed when you are  awake and breathing on your own. Heart trouble. Lung trouble. Confusion that gets better with time (emergence delirium). What happens before the procedure? When to stop eating and drinking Follow instructions from your health care provider about what you may eat and drink. These may include: 8 hours before your procedure Stop eating most foods. Do not eat meat, fried foods, or fatty foods. Eat only light foods, such as toast or crackers. All liquids are okay except energy drinks and alcohol. 6 hours before your procedure Stop eating. Drink only clear liquids, such as water, clear fruit juice, black coffee, plain tea, and sports drinks. Do not drink energy drinks or alcohol. 2 hours before your procedure Stop drinking all liquids. You may be allowed to take medicines with small sips of water. If you do not follow your health care  provider's instructions, your procedure may be delayed or canceled. Medicines Ask your health care provider about: Changing or stopping your regular medicines. These include any diabetes medicines or blood thinners you take. Taking medicines such as aspirin and ibuprofen. These medicines can thin your blood. Do not take them unless your health care provider tells you to. Taking over-the-counter medicines, vitamins, herbs, and supplements. Testing You may have an exam or testing. You may have a blood or urine sample taken. General instructions Do not use any products that contain nicotine or tobacco for at least 4 weeks before the procedure. These products include cigarettes, chewing tobacco, and vaping devices, such as e-cigarettes. If you need help quitting, ask your health care provider. If you will be going home right after the procedure, plan to have a responsible adult: Take you home from the hospital or clinic. You will not be allowed to drive. Care for you for the time you are told. What happens during the procedure?  Your blood pressure, heart rate, breathing, level of pain, and blood oxygen level will be monitored. An IV will be inserted into one of your veins. You may be given: A sedative. This helps you relax. Anesthesia. This will: Numb certain areas of your body. Make you fall asleep for surgery. You will be given medicines as needed to keep you comfortable. The more medicine you are given, the deeper your level of sedation will be. Your level of sedation may be changed to fit your needs. There are three levels of sedation: Mild sedation. At this level, you may feel awake and relaxed. You will be able to follow directions. Moderate sedation. At this level, you will be sleepy. You may not remember the procedure. Deep sedation. At this level, you will be asleep. You will not remember the procedure. How you get the medicines will depend on your age and the procedure. They may be  given as: A pill. This may be taken by mouth (orally) or inserted into the rectum. An injection. This may be into a vein or muscle. A spray through the nose. After your procedure is over, the medicine will be stopped. The procedure may vary among health care providers and hospitals. What happens after the procedure? Your blood pressure, heart rate, breathing rate, and blood oxygen level will be monitored until you leave the hospital or clinic. You may feel sleepy, clumsy, or nauseous. You may not remember what happened during or after the procedure. Sedation can affect you for several hours. Do not drive or use machinery until your health care provider says that it is safe. This information is not intended to replace advice given to you by  your health care provider. Make sure you discuss any questions you have with your health care provider. Document Revised: 09/23/2021 Document Reviewed: 09/23/2021 Elsevier Patient Education  [place].

## 2022-06-30 ENCOUNTER — Other Ambulatory Visit: Payer: Self-pay | Admitting: Obstetrics & Gynecology

## 2022-06-30 ENCOUNTER — Encounter (HOSPITAL_COMMUNITY): Payer: Self-pay

## 2022-06-30 ENCOUNTER — Encounter (HOSPITAL_COMMUNITY)
Admission: RE | Admit: 2022-06-30 | Discharge: 2022-06-30 | Disposition: A | Discharge status: Home / Self Care | Payer: BC Managed Care – PPO | Source: Ambulatory Visit | Attending: Obstetrics & Gynecology | Admitting: Obstetrics & Gynecology

## 2022-06-30 VITALS — BP 123/70 | HR 72 | Temp 97.6°F | Resp 18 | Ht 66.0 in | Wt 164.0 lb

## 2022-06-30 DIAGNOSIS — Z01818 Encounter for other preprocedural examination: Secondary | ICD-10-CM | POA: Insufficient documentation

## 2022-06-30 DIAGNOSIS — R102 Pelvic and perineal pain: Secondary | ICD-10-CM | POA: Diagnosis not present

## 2022-06-30 DIAGNOSIS — I10 Essential (primary) hypertension: Secondary | ICD-10-CM

## 2022-06-30 DIAGNOSIS — G8929 Other chronic pain: Secondary | ICD-10-CM | POA: Diagnosis not present

## 2022-06-30 DIAGNOSIS — N8301 Follicular cyst of right ovary: Secondary | ICD-10-CM | POA: Diagnosis not present

## 2022-06-30 DIAGNOSIS — Z79899 Other long term (current) drug therapy: Secondary | ICD-10-CM | POA: Diagnosis not present

## 2022-06-30 DIAGNOSIS — Z90710 Acquired absence of both cervix and uterus: Secondary | ICD-10-CM | POA: Diagnosis not present

## 2022-06-30 DIAGNOSIS — N83202 Unspecified ovarian cyst, left side: Secondary | ICD-10-CM | POA: Diagnosis present

## 2022-06-30 DIAGNOSIS — T502X5A Adverse effect of carbonic-anhydrase inhibitors, benzothiadiazides and other diuretics, initial encounter: Secondary | ICD-10-CM

## 2022-06-30 HISTORY — DX: Depression, unspecified: F32.A

## 2022-06-30 HISTORY — DX: Anxiety disorder, unspecified: F41.9

## 2022-06-30 LAB — CBC
HCT: 38.5 % (ref 36.0–46.0)
Hemoglobin: 13.1 g/dL (ref 12.0–15.0)
MCH: 30.8 pg (ref 26.0–34.0)
MCHC: 34 g/dL (ref 30.0–36.0)
MCV: 90.4 fL (ref 80.0–100.0)
Platelets: 282 10*3/uL (ref 150–400)
RBC: 4.26 MIL/uL (ref 3.87–5.11)
RDW: 12.3 % (ref 11.5–15.5)
WBC: 8.4 10*3/uL (ref 4.0–10.5)
nRBC: 0 % (ref 0.0–0.2)

## 2022-06-30 LAB — URINALYSIS, ROUTINE W REFLEX MICROSCOPIC
Bacteria, UA: NONE SEEN
Bilirubin Urine: NEGATIVE
Glucose, UA: NEGATIVE mg/dL
Ketones, ur: NEGATIVE mg/dL
Leukocytes,Ua: NEGATIVE
Nitrite: NEGATIVE
Protein, ur: NEGATIVE mg/dL
Specific Gravity, Urine: 1.008 (ref 1.005–1.030)
pH: 6 (ref 5.0–8.0)

## 2022-06-30 LAB — COMPREHENSIVE METABOLIC PANEL
ALT: 17 U/L (ref 0–44)
AST: 18 U/L (ref 15–41)
Albumin: 4.2 g/dL (ref 3.5–5.0)
Alkaline Phosphatase: 76 U/L (ref 38–126)
Anion gap: 7 (ref 5–15)
BUN: 12 mg/dL (ref 6–20)
CO2: 27 mmol/L (ref 22–32)
Calcium: 8.8 mg/dL — ABNORMAL LOW (ref 8.9–10.3)
Chloride: 98 mmol/L (ref 98–111)
Creatinine, Ser: 0.64 mg/dL (ref 0.44–1.00)
GFR, Estimated: >60 mL/min (ref >60)
Glucose, Bld: 95 mg/dL (ref 70–99)
Potassium: 3.3 mmol/L — ABNORMAL LOW (ref 3.5–5.1)
Sodium: 132 mmol/L — ABNORMAL LOW (ref 135–145)
Total Bilirubin: 0.5 mg/dL (ref 0.3–1.2)
Total Protein: 7.8 g/dL (ref 6.5–8.1)

## 2022-06-30 LAB — TYPE AND SCREEN
ABO/RH(D): B NEG
Antibody Screen: NEGATIVE

## 2022-06-30 LAB — RAPID HIV SCREEN (HIV 1/2 AB+AG)
HIV 1/2 Antibodies: NONREACTIVE
HIV-1 P24 Antigen - HIV24: NONREACTIVE

## 2022-06-30 MED ORDER — BUPIVACAINE LIPOSOME 1.3 % IJ SUSP: 20.0000 mL | Freq: Once | INTRAMUSCULAR | Status: AC

## 2022-06-30 MED ORDER — KETOROLAC TROMETHAMINE 30 MG/ML IJ SOLN
30.0000 mg | Freq: Once | INTRAMUSCULAR | Status: AC
  Administered 2022-07-02: 30 mg via INTRAVENOUS

## 2022-07-01 ENCOUNTER — Encounter: Payer: Self-pay | Admitting: Obstetrics & Gynecology

## 2022-07-02 ENCOUNTER — Ambulatory Visit (HOSPITAL_COMMUNITY): Payer: BC Managed Care – PPO | Admitting: Anesthesiology

## 2022-07-02 ENCOUNTER — Ambulatory Visit (HOSPITAL_COMMUNITY)
Admission: RE | Admit: 2022-07-02 | Discharge: 2022-07-02 | Disposition: A | Discharge status: Home / Self Care | Payer: BC Managed Care – PPO | Attending: Obstetrics & Gynecology | Admitting: Obstetrics & Gynecology

## 2022-07-02 ENCOUNTER — Encounter (HOSPITAL_COMMUNITY)
Admission: RE | Disposition: A | Discharge status: Home / Self Care | Payer: Self-pay | Source: Home / Self Care | Attending: Obstetrics & Gynecology

## 2022-07-02 ENCOUNTER — Other Ambulatory Visit: Payer: Self-pay

## 2022-07-02 ENCOUNTER — Encounter (HOSPITAL_COMMUNITY): Payer: Self-pay | Admitting: Obstetrics & Gynecology

## 2022-07-02 DIAGNOSIS — G8929 Other chronic pain: Secondary | ICD-10-CM | POA: Insufficient documentation

## 2022-07-02 DIAGNOSIS — N7011 Chronic salpingitis: Secondary | ICD-10-CM

## 2022-07-02 DIAGNOSIS — R102 Pelvic and perineal pain: Secondary | ICD-10-CM | POA: Insufficient documentation

## 2022-07-02 DIAGNOSIS — Z90710 Acquired absence of both cervix and uterus: Secondary | ICD-10-CM | POA: Insufficient documentation

## 2022-07-02 DIAGNOSIS — N8301 Follicular cyst of right ovary: Secondary | ICD-10-CM | POA: Insufficient documentation

## 2022-07-02 DIAGNOSIS — N83291 Other ovarian cyst, right side: Secondary | ICD-10-CM | POA: Diagnosis not present

## 2022-07-02 DIAGNOSIS — I10 Essential (primary) hypertension: Secondary | ICD-10-CM | POA: Insufficient documentation

## 2022-07-02 DIAGNOSIS — Z79899 Other long term (current) drug therapy: Secondary | ICD-10-CM | POA: Insufficient documentation

## 2022-07-02 DIAGNOSIS — N83201 Unspecified ovarian cyst, right side: Secondary | ICD-10-CM

## 2022-07-02 DIAGNOSIS — N951 Menopausal and female climacteric states: Secondary | ICD-10-CM | POA: Diagnosis not present

## 2022-07-02 DIAGNOSIS — N83292 Other ovarian cyst, left side: Secondary | ICD-10-CM | POA: Diagnosis not present

## 2022-07-02 DIAGNOSIS — N838 Other noninflammatory disorders of ovary, fallopian tube and broad ligament: Secondary | ICD-10-CM | POA: Diagnosis not present

## 2022-07-02 HISTORY — PX: ROBOTIC ASSISTED BILATERAL SALPINGO OOPHERECTOMY: SHX6078

## 2022-07-02 LAB — ABO/RH: ABO/RH(D): B NEG

## 2022-07-02 SURGERY — SALPINGO-OOPHORECTOMY, BILATERAL, ROBOT-ASSISTED
Anesthesia: General | Site: Abdomen | Laterality: Bilateral

## 2022-07-02 MED ORDER — ROCURONIUM BROMIDE 10 MG/ML (PF) SYRINGE
PREFILLED_SYRINGE | INTRAVENOUS | Status: AC
  Filled 2022-07-02: qty 10

## 2022-07-02 MED ORDER — FENTANYL CITRATE (PF) 100 MCG/2ML IJ SOLN
INTRAMUSCULAR | Status: AC
  Filled 2022-07-02: qty 2

## 2022-07-02 MED ORDER — LACTATED RINGERS IV SOLN: INTRAVENOUS | Status: DC

## 2022-07-02 MED ORDER — PROPOFOL 10 MG/ML IV BOLUS
INTRAVENOUS | Status: DC | PRN
  Administered 2022-07-02: 200 mg via INTRAVENOUS

## 2022-07-02 MED ORDER — ONDANSETRON HCL 4 MG/2ML IJ SOLN
INTRAMUSCULAR | Status: AC
  Filled 2022-07-02: qty 2

## 2022-07-02 MED ORDER — KETOROLAC TROMETHAMINE 30 MG/ML IJ SOLN
INTRAMUSCULAR | Status: AC
  Filled 2022-07-02: qty 1

## 2022-07-02 MED ORDER — SCOPOLAMINE 1 MG/3DAYS TD PT72
1.0000 | MEDICATED_PATCH | Freq: Once | TRANSDERMAL | Status: DC
  Administered 2022-07-02: 1.5 mg via TRANSDERMAL

## 2022-07-02 MED ORDER — ONDANSETRON HCL 4 MG/2ML IJ SOLN
INTRAMUSCULAR | Status: DC | PRN
  Administered 2022-07-02: 4 mg via INTRAVENOUS

## 2022-07-02 MED ORDER — CEFAZOLIN SODIUM-DEXTROSE 2-4 GM/100ML-% IV SOLN
2.0000 g | INTRAVENOUS | Status: AC
  Administered 2022-07-02: 2 g via INTRAVENOUS

## 2022-07-02 MED ORDER — LIDOCAINE HCL (CARDIAC) PF 100 MG/5ML IV SOSY
PREFILLED_SYRINGE | INTRAVENOUS | Status: DC | PRN
  Administered 2022-07-02: 100 mg via INTRAVENOUS

## 2022-07-02 MED ORDER — PROPOFOL 10 MG/ML IV BOLUS
INTRAVENOUS | Status: AC
  Filled 2022-07-02: qty 20

## 2022-07-02 MED ORDER — BUPIVACAINE HCL (PF) 0.5 % IJ SOLN
INTRAMUSCULAR | Status: AC
  Filled 2022-07-02: qty 30

## 2022-07-02 MED ORDER — BUPIVACAINE LIPOSOME 1.3 % IJ SUSP
INTRAMUSCULAR | Status: DC | PRN
  Administered 2022-07-02: 20 mL

## 2022-07-02 MED ORDER — LIDOCAINE HCL (PF) 2 % IJ SOLN
INTRAMUSCULAR | Status: AC
  Filled 2022-07-02: qty 5

## 2022-07-02 MED ORDER — CEFAZOLIN SODIUM-DEXTROSE 2-4 GM/100ML-% IV SOLN
INTRAVENOUS | Status: AC
  Filled 2022-07-02: qty 100

## 2022-07-02 MED ORDER — ONDANSETRON 8 MG PO TBDP: 8.0000 mg | ORAL_TABLET | Freq: Three times a day (TID) | ORAL | 0 refills | Status: DC | PRN

## 2022-07-02 MED ORDER — BUPIVACAINE LIPOSOME 1.3 % IJ SUSP
INTRAMUSCULAR | Status: AC
  Filled 2022-07-02: qty 20

## 2022-07-02 MED ORDER — DEXAMETHASONE SODIUM PHOSPHATE 10 MG/ML IJ SOLN
INTRAMUSCULAR | Status: AC
  Filled 2022-07-02: qty 1

## 2022-07-02 MED ORDER — ROCURONIUM BROMIDE 10 MG/ML (PF) SYRINGE
PREFILLED_SYRINGE | INTRAVENOUS | Status: DC | PRN
  Administered 2022-07-02: 10 mg via INTRAVENOUS
  Administered 2022-07-02: 80 mg via INTRAVENOUS

## 2022-07-02 MED ORDER — SCOPOLAMINE 1 MG/3DAYS TD PT72
MEDICATED_PATCH | TRANSDERMAL | Status: AC
  Filled 2022-07-02: qty 1

## 2022-07-02 MED ORDER — CHLORHEXIDINE GLUCONATE 0.12 % MT SOLN
OROMUCOSAL | Status: AC
  Filled 2022-07-02: qty 15

## 2022-07-02 MED ORDER — POVIDONE-IODINE 10 % EX SWAB
2.0000 | Freq: Once | CUTANEOUS | Status: AC
  Administered 2022-07-02: 2 via TOPICAL

## 2022-07-02 MED ORDER — MIDAZOLAM HCL 5 MG/5ML IJ SOLN
INTRAMUSCULAR | Status: DC | PRN
  Administered 2022-07-02: 2 mg via INTRAVENOUS

## 2022-07-02 MED ORDER — HYDROMORPHONE HCL 1 MG/ML IJ SOLN: 0.2500 mg | INTRAMUSCULAR | Status: DC | PRN

## 2022-07-02 MED ORDER — FENTANYL CITRATE (PF) 100 MCG/2ML IJ SOLN
INTRAMUSCULAR | Status: DC | PRN
  Administered 2022-07-02 (×2): 50 ug via INTRAVENOUS

## 2022-07-02 MED ORDER — ORAL CARE MOUTH RINSE: 15.0000 mL | Freq: Once | OROMUCOSAL | Status: AC

## 2022-07-02 MED ORDER — MEPERIDINE HCL 50 MG/ML IJ SOLN: 6.2500 mg | INTRAMUSCULAR | Status: DC | PRN

## 2022-07-02 MED ORDER — DEXMEDETOMIDINE HCL IN NACL 80 MCG/20ML IV SOLN
INTRAVENOUS | Status: DC | PRN
  Administered 2022-07-02: 4 ug via BUCCAL
  Administered 2022-07-02: 12 ug via BUCCAL
  Administered 2022-07-02: 4 ug via BUCCAL

## 2022-07-02 MED ORDER — CHLORHEXIDINE GLUCONATE 0.12 % MT SOLN
15.0000 mL | Freq: Once | OROMUCOSAL | Status: AC
  Administered 2022-07-02: 15 mL via OROMUCOSAL

## 2022-07-02 MED ORDER — STERILE WATER FOR IRRIGATION IR SOLN
Status: DC | PRN
  Administered 2022-07-02: 500 mL

## 2022-07-02 MED ORDER — ONDANSETRON HCL 4 MG/2ML IJ SOLN: 4.0000 mg | Freq: Once | INTRAMUSCULAR | Status: DC | PRN

## 2022-07-02 MED ORDER — MIDAZOLAM HCL 2 MG/2ML IJ SOLN
INTRAMUSCULAR | Status: AC
  Filled 2022-07-02: qty 2

## 2022-07-02 MED ORDER — HYDROCODONE-ACETAMINOPHEN 5-325 MG PO TABS: 1.0000 | ORAL_TABLET | Freq: Four times a day (QID) | ORAL | 0 refills | Status: DC | PRN

## 2022-07-02 MED ORDER — KETOROLAC TROMETHAMINE 10 MG PO TABS: 10.0000 mg | ORAL_TABLET | Freq: Three times a day (TID) | ORAL | 0 refills | Status: DC | PRN

## 2022-07-02 MED ORDER — SUGAMMADEX SODIUM 200 MG/2ML IV SOLN
INTRAVENOUS | Status: DC | PRN
  Administered 2022-07-02: 200 mg via INTRAVENOUS

## 2022-07-02 MED ORDER — DEXAMETHASONE SODIUM PHOSPHATE 10 MG/ML IJ SOLN
INTRAMUSCULAR | Status: DC | PRN
  Administered 2022-07-02: 10 mg via INTRAVENOUS

## 2022-07-02 SURGICAL SUPPLY — 54 items
ADH SKN CLS APL DERMABOND .7 (GAUZE/BANDAGES/DRESSINGS) ×1
ANTIFOG SOL W/FOAM PAD STRL (MISCELLANEOUS) ×1
APL SWBSTK 6 STRL LF DISP (MISCELLANEOUS) ×1
APPLICATOR COTTON TIP 6 STRL (MISCELLANEOUS) IMPLANT
APPLICATOR COTTON TIP 6IN STRL (MISCELLANEOUS) ×1
BLADE SURG SZ11 CARB STEEL (BLADE) ×1 IMPLANT
CANNULA REDUC XI 12-8 STAPL (CANNULA) ×1
CANNULA REDUCER 12-8 DVNC XI (CANNULA) IMPLANT
COVER LIGHT HANDLE STERIS (MISCELLANEOUS) ×2 IMPLANT
COVER MAYO STAND XLG (MISCELLANEOUS) ×1 IMPLANT
DERMABOND ADVANCED .7 DNX12 (GAUZE/BANDAGES/DRESSINGS) ×1 IMPLANT
DRAPE ARM DVNC X/XI (DISPOSABLE) ×3 IMPLANT
DRAPE COLUMN DVNC XI (DISPOSABLE) ×1 IMPLANT
DRAPE DA VINCI XI ARM (DISPOSABLE) ×3
DRAPE DA VINCI XI COLUMN (DISPOSABLE) ×1
ELECT PENCIL ROCKER SW 15FT (MISCELLANEOUS) IMPLANT
ELECT REM PT RETURN 9FT ADLT (ELECTROSURGICAL) ×1
ELECTRODE REM PT RTRN 9FT ADLT (ELECTROSURGICAL) ×1 IMPLANT
GLOVE BIOGEL PI IND STRL 7.0 (GLOVE) IMPLANT
GLOVE BIOGEL PI IND STRL 8 (GLOVE) ×2 IMPLANT
GLOVE ECLIPSE 6.5 STRL STRAW (GLOVE) IMPLANT
GLOVE ECLIPSE 8.0 STRL XLNG CF (GLOVE) ×2 IMPLANT
GLOVE SURG SS PI 7.5 STRL IVOR (GLOVE) IMPLANT
GOWN STRL REUS W/TWL LRG LVL3 (GOWN DISPOSABLE) IMPLANT
GOWN STRL REUS W/TWL XL LVL3 (GOWN DISPOSABLE) ×2 IMPLANT
KIT TURNOVER KIT A (KITS) ×1 IMPLANT
MANIFOLD NEPTUNE II (INSTRUMENTS) ×1 IMPLANT
NDL HYPO 21X1.5 SAFETY (NEEDLE) ×1 IMPLANT
NDL INSUFFLATION 14GA 120MM (NEEDLE) IMPLANT
NEEDLE HYPO 21X1.5 SAFETY (NEEDLE) ×1 IMPLANT
NEEDLE INSUFFLATION 14GA 120MM (NEEDLE) ×1 IMPLANT
OBTURATOR OPTICAL STANDARD 8MM (TROCAR) ×1
OBTURATOR OPTICAL STND 8 DVNC (TROCAR) ×1
OBTURATOR OPTICALSTD 8 DVNC (TROCAR) ×1 IMPLANT
PACK LAP CHOLE LZT030E (CUSTOM PROCEDURE TRAY) IMPLANT
PAD POSITIONING PINK XL (MISCELLANEOUS) IMPLANT
SEAL CANN UNIV 5-8 DVNC XI (MISCELLANEOUS) ×2 IMPLANT
SEAL XI 5MM-8MM UNIVERSAL (MISCELLANEOUS) ×2
SEALER VESSEL DA VINCI XI (MISCELLANEOUS) ×1
SEALER VESSEL EXT DVNC XI (MISCELLANEOUS) ×1 IMPLANT
SET BASIN LINEN APH (SET/KITS/TRAYS/PACK) ×1 IMPLANT
SET TUBE SMOKE EVAC HIGH FLOW (TUBING) ×1 IMPLANT
SOLUTION ANTFG W/FOAM PAD STRL (MISCELLANEOUS) ×1 IMPLANT
STAPLER CANNULA SEAL DVNC XI (STAPLE) IMPLANT
STAPLER CANNULA SEAL XI (STAPLE) ×1
SUT VICRYL 0 UR6 27IN ABS (SUTURE) IMPLANT
SUT VICRYL AB 3-0 FS1 BRD 27IN (SUTURE) IMPLANT
SYS BAG RETRIEVAL 10MM (BASKET) ×2
SYSTEM BAG RETRIEVAL 10MM (BASKET) ×1 IMPLANT
TAPE TRANSPORE STRL 2 31045 (GAUZE/BANDAGES/DRESSINGS) ×1 IMPLANT
TRAY FOLEY W/BAG SLVR 16FR (SET/KITS/TRAYS/PACK) ×1
TRAY FOLEY W/BAG SLVR 16FR ST (SET/KITS/TRAYS/PACK) IMPLANT
TUBE CONNECTING 12X1/4 (SUCTIONS) IMPLANT
WATER STERILE IRR 500ML POUR (IV SOLUTION) ×1 IMPLANT

## 2022-07-02 NOTE — Anesthesia Postprocedure Evaluation (Signed)
Anesthesia Post Note  Patient: Melanie Cochran  Procedure(s) Performed: XI ROBOTIC ASSISTED BILATERAL SALPINGO OOPHORECTOMY (Bilateral: Abdomen)  Patient location during evaluation: Phase II Anesthesia Type: General Level of consciousness: awake and alert and oriented Pain management: pain level controlled Vital Signs Assessment: post-procedure vital signs reviewed and stable Respiratory status: spontaneous breathing, nonlabored ventilation and respiratory function stable Cardiovascular status: blood pressure returned to baseline and stable Postop Assessment: no apparent nausea or vomiting Anesthetic complications: no  No notable events documented.   Last Vitals:  Vitals:   07/02/22 1045 07/02/22 1109  BP: 117/67 131/89  Pulse: 68 89  Resp: 15 16  Temp:  36.9 C  SpO2: 97% 100%    Last Pain:  Vitals:   07/02/22 1109  TempSrc: Oral  PainSc: 4                  Zakyia Gagan C Johnson [place]

## 2022-07-02 NOTE — Transfer of Care (Signed)
Immediate Anesthesia Transfer of Care Note  Patient: Melanie Cochran  Procedure(s) Performed: XI ROBOTIC ASSISTED BILATERAL SALPINGO OOPHORECTOMY (Bilateral: Abdomen)  Patient Location: PACU  Anesthesia Type:General  Level of Consciousness: awake, alert , oriented, sedated, and patient cooperative  Airway & Oxygen Therapy: Patient Spontanous Breathing and Patient connected to face mask oxygen  Post-op Assessment: Report given to RN, Post -op Vital signs reviewed and stable, and Patient moving all extremities X 4  Post vital signs: Reviewed and stable  Last Vitals:  Vitals Value Taken Time  BP 132/68 07/02/22 0950  Temp    Pulse 78 07/02/22 0952  Resp 17 07/02/22 0952  SpO2 100 % 07/02/22 0952  Vitals shown include unvalidated device data.  Last Pain:  Vitals:   07/02/22 0659  TempSrc: Oral         Complications: No notable events documented.

## 2022-07-02 NOTE — Anesthesia Procedure Notes (Signed)
Procedure Name: Intubation Date/Time: 07/02/2022 7:41 AM  Performed by: Annamary Carolin, CRNAPre-anesthesia Checklist: Patient identified, Emergency Drugs available, Suction available and Patient being monitored Patient Re-evaluated:Patient Re-evaluated prior to induction Oxygen Delivery Method: Circle System Utilized Preoxygenation: Pre-oxygenation with 100% oxygen Induction Type: IV induction Ventilation: Mask ventilation without difficulty Laryngoscope Size: Mac and 3 Grade View: Grade I Tube type: Oral Tube size: 7.0 mm Number of attempts: 1 Airway Equipment and Method: Stylet Placement Confirmation: ETT inserted through vocal cords under direct vision, positive ETCO2 and breath sounds checked- equal and bilateral Secured at: 21 cm Tube secured with: Tape Dental Injury: Teeth and Oropharynx as per pre-operative assessment  Comments: With ease and clear.

## 2022-07-02 NOTE — H&P (Signed)
Preoperative History and Physical  Melanie Cochran is a 47 y.o. HX:[id number] with No LMP recorded. Patient has had a hysterectomy. admitted for a Xi robotic assisted removal of both tubes and ovaries.  Patient has been experiencing pelvic pain since last summer, intermittent mild to moderate without associated GI symptoms Sonogram revealed small bilateral ovarian cysts and there is has been interval growth ROMA studies are negative Her symptoms persist Likely adherent to pelvic sidewall Likely left hydrosalpinx s/p hysterectomy  PMH:    Past Medical History:  Diagnosis Date   Anxiety    Depression    Hypertension     PSH:     Past Surgical History:  Procedure Laterality Date   ABDOMINAL HYSTERECTOMY     BREAST BIOPSY Right    20 yrs ago   BREAST EXCISIONAL BIOPSY Right 2020   CESAREAN SECTION     ESOPHAGOGASTRODUODENOSCOPY N/A 03/12/2016   Procedure: ESOPHAGOGASTRODUODENOSCOPY (EGD);  Surgeon: Daneil Dolin, MD;  Location: [facility];  Service: Endoscopy;  Laterality: N/A;  915   PARTIAL HYSTERECTOMY     TUBAL LIGATION      POb/GynH:      OB History     Gravida  2   Para  2   Term  1   Preterm  1   AB      Living  2      SAB      IAB      Ectopic      Multiple      Live Births  2           SH:   Social History   Tobacco Use   Smoking status: Former    Packs/day: 0.50    Types: Cigarettes   Smokeless tobacco: Never  Vaping Use   Vaping Use: Former  Substance Use Topics   Alcohol use: Yes    Comment: socially   Drug use: No    FH:    Family History  Problem Relation Age of Onset   Stroke Paternal 8    Breast cancer Maternal Grandmother    Hypertension Father    Breast cancer Mother 33   Hypertension Mother    Breast cancer Maternal Aunt    Colon cancer Neg Hx      Allergies:  Allergies  Allergen Reactions   Penicillins Other (See Comments)    Childhood reaction Has patient had a PCN reaction causing immediate  rash, facial/tongue/throat swelling, SOB or lightheadedness with hypotension:unsure Has patient had a PCN reaction causing severe rash involving mucus membranes or skin necrosis:unsure Has patient had a PCN reaction that required hospitalization:doesn't think so Has patient had a PCN reaction occurring within the last 10 years:No If all of the above answers are "NO", then may proceed with Cephalosporin use.    Lamictal [Lamotrigine] Rash    Medications:      No current facility-administered medications for this encounter.  Facility-Administered Medications Ordered in Other Encounters:    bupivacaine liposome (EXPAREL) 1.3 % injection 266 mg, 20 mL, Infiltration, Once, Arihana Ambrocio, Mertie Clause, MD   ketorolac (TORADOL) 30 MG/ML injection 30 mg, 30 mg, Intravenous, Once, Florian Buff, MD  Review of Systems:   Review of Systems  Constitutional: Negative for fever, chills, weight loss, malaise/fatigue and diaphoresis.  HENT: Negative for hearing loss, ear pain, nosebleeds, congestion, sore throat, neck pain, tinnitus and ear discharge.   Eyes: Negative for blurred vision, double vision, photophobia, pain, discharge and redness.  Respiratory: Negative for cough, hemoptysis, sputum production, shortness of breath, wheezing and stridor.   Cardiovascular: Negative for chest pain, palpitations, orthopnea, claudication, leg swelling and PND.  Gastrointestinal: Positive for abdominal pain. Negative for heartburn, nausea, vomiting, diarrhea, constipation, blood in stool and melena.  Genitourinary: Negative for dysuria, urgency, frequency, hematuria and flank pain.  Musculoskeletal: Negative for myalgias, back pain, joint pain and falls.  Skin: Negative for itching and rash.  Neurological: Negative for dizziness, tingling, tremors, sensory change, speech change, focal weakness, seizures, loss of consciousness, weakness and headaches.  Endo/Heme/Allergies: Negative for environmental allergies and polydipsia.  Does not bruise/bleed easily.  Psychiatric/Behavioral: Negative for depression, suicidal ideas, hallucinations, memory loss and substance abuse. The patient is not nervous/anxious and does not have insomnia.      PHYSICAL EXAM:  There were no vitals taken for this visit.    Vitals reviewed. Constitutional: She is oriented to person, place, and time. She appears well-developed and well-nourished.  HENT:  Head: Normocephalic and atraumatic.  Right Ear: External ear normal.  Left Ear: External ear normal.  Nose: Nose normal.  Mouth/Throat: Oropharynx is clear and moist.  Eyes: Conjunctivae and EOM are normal. Pupils are equal, round, and reactive to light. Right eye exhibits no discharge. Left eye exhibits no discharge. No scleral icterus.  Neck: Normal range of motion. Neck supple. No tracheal deviation present. No thyromegaly present.  Cardiovascular: Normal rate, regular rhythm, normal heart sounds and intact distal pulses.  Exam reveals no gallop and no friction rub.   No murmur heard. Respiratory: Effort normal and breath sounds normal. No respiratory distress. She has no wheezes. She has no rales. She exhibits no tenderness.  GI: Soft. Bowel sounds are normal. She exhibits no distension and no mass. There is tenderness. There is no rebound and no guarding.  Genitourinary:       Vulva is normal without lesions Vagina is pink moist without discharge Cervix absent Uterus is uterus absent Adnexa is by sonogram  Musculoskeletal: Normal range of motion. She exhibits no edema and no tenderness.  Neurological: She is alert and oriented to person, place, and time. She has normal reflexes. She displays normal reflexes. No cranial nerve deficit. She exhibits normal muscle tone. Coordination normal.  Skin: Skin is warm and dry. No rash noted. No erythema. No pallor.  Psychiatric: She has a normal mood and affect. Her behavior is normal. Judgment and thought content normal.     Labs: Results for orders placed or performed during the hospital encounter of 06/30/22 (from the past 336 hour(s))  Type and screen   Collection Time: 06/30/22  3:38 PM  Result Value Ref Range   ABO/RH(D) B NEG    Antibody Screen NEG    Sample Expiration 07/14/2022,2359    Extend sample reason      NO TRANSFUSIONS OR PREGNANCY IN THE PAST 3 MONTHS Performed at [facility], [address] [place], [place] [address]   CBC   Collection Time: 06/30/22  3:39 PM  Result Value Ref Range   WBC 8.4 4.0 - 10.5 K/uL   RBC 4.26 3.87 - 5.11 MIL/uL   Hemoglobin 13.1 12.0 - 15.0 g/dL   HCT 38.5 36.0 - 46.0 %   MCV 90.4 80.0 - 100.0 fL   MCH 30.8 26.0 - 34.0 pg   MCHC 34.0 30.0 - 36.0 g/dL   RDW 12.3 11.5 - 15.5 %   Platelets 282 150 - 400 K/uL   nRBC 0.0 0.0 - 0.2 %  Comprehensive metabolic panel   Collection Time: 06/30/22  3:39 PM  Result Value Ref Range   Sodium 132 (L) 135 - 145 mmol/L   Potassium 3.3 (L) 3.5 - 5.1 mmol/L   Chloride 98 98 - 111 mmol/L   CO2 27 22 - 32 mmol/L   Glucose, Bld 95 70 - 99 mg/dL   BUN 12 6 - 20 mg/dL   Creatinine, Ser 0.64 0.44 - 1.00 mg/dL   Calcium 8.8 (L) 8.9 - 10.3 mg/dL   Total Protein 7.8 6.5 - 8.1 g/dL   Albumin 4.2 3.5 - 5.0 g/dL   AST 18 15 - 41 U/L   ALT 17 0 - 44 U/L   Alkaline Phosphatase 76 38 - 126 U/L   Total Bilirubin 0.5 0.3 - 1.2 mg/dL   GFR, Estimated >60 >60 mL/min   Anion gap 7 5 - 15  Rapid HIV screen (HIV 1/2 Ab+Ag)   Collection Time: 06/30/22  3:39 PM  Result Value Ref Range   HIV-1 P24 Antigen - HIV24 NON REACTIVE NON REACTIVE   HIV 1/2 Antibodies NON REACTIVE NON REACTIVE   Interpretation (HIV Ag Ab)      A non reactive test result means that HIV 1 or HIV 2 antibodies and HIV 1 p24 antigen were not detected in the specimen.  Urinalysis, Routine w reflex microscopic -Urine, Clean Catch   Collection Time: 06/30/22  3:39 PM  Result Value Ref Range   Color, Urine STRAW (A) YELLOW   APPearance CLEAR CLEAR   Specific  Gravity, Urine 1.008 1.005 - 1.030   pH 6.0 5.0 - 8.0   Glucose, UA NEGATIVE NEGATIVE mg/dL   Hgb urine dipstick SMALL (A) NEGATIVE   Bilirubin Urine NEGATIVE NEGATIVE   Ketones, ur NEGATIVE NEGATIVE mg/dL   Protein, ur NEGATIVE NEGATIVE mg/dL   Nitrite NEGATIVE NEGATIVE   Leukocytes,Ua NEGATIVE NEGATIVE   RBC / HPF 0-5 0 - 5 RBC/hpf   WBC, UA 0-5 0 - 5 WBC/hpf   Bacteria, UA NONE SEEN NONE SEEN   Squamous Epithelial / HPF 0-5 0 - 5 /HPF    EKG: Orders placed or performed during the hospital encounter of 06/30/22   EKG 12-LEAD   EKG 12-LEAD    Imaging Studies: No results found.    Assessment: Chronic pelvic pain Bilateral enlarging ovarian cystic structures Normal ROMA testing S/P TAH  Plan: Xi Robotic assisted removal of both tubes and ovaries  Pt understands the risks of surgery including but not limited t  excessive bleeding requiring transfusion or reoperation, post-operative infection requiring prolonged hospitalization or re-hospitalization and antibiotic therapy, and damage to other organs including bladder, bowel, ureters and major vessels.  The patient also understands the alternative treatment options which were discussed in full.  All questions were answered.  Florian Buff 07/02/2022 6:59 AM   Florian Buff 07/02/2022 6:51 AM

## 2022-07-02 NOTE — Op Note (Signed)
Preoperative diagnosis: Bilateral ovarian cyst, enlarging, as compared to previous sonogram Right lower quadrant pain Normal ROMA testing  Postoperative diagnosis: Same as above plus adherence to the pelvic sidewalls bilaterally  Procedure: Robotic assisted laparoscopic removal of both ovaries and tubes  Surgeon: Florian Buff, MD  Anesthesia: General endotracheal  Findings: Preoperatively the patient was noted to have a right ovary with 2 simple cyst which had enlarged significantly since her last sonogram 6 months ago.  Her Roma testing at that time was normal.  However her right lower quadrant pain has persisted.  In addition there was a cystic structure on the left which was most likely a hydrosalpinx from her previous hysterectomy.  Upon discussing the options the patient opted for surgical removal and specifically robotically assisted laparoscopic technique.  Description of operation: The patient was taken to the operating room and placed in the supine position where she underwent general endotracheal anesthesia She was prepped and draped in the usual sterile fashion A Foley catheter was placed An incision to allow an 8 mm surgical port was placed supraumbilically The fascia was grasped with a Coker clamp and the Veress needle was placed into the peritoneal cavity with 1 pass without difficulty Saline drop test and pressure confirmed peritoneal cavity The peritoneal cavity was then insufflated An 8 mm direct visualization surgiport using a nonbladed trocar was placed into the peritoneal cavity with 1 pass without difficulty  A survey was taken of the abdomen and pelvis The left ovary and hydrosalpinx or peritoneal inclusion cyst were adherent to the left pelvic sidewall down to the top of the vagina on the left The left ureter was reviewed transperitoneally and was found to be well out of the way of the intended dissection with removal of the left ovary and tube  The right  ovary was also identified with the cystic structures noted on sonogram preoperatively It 2 was adherent to the right pelvic sidewall down to the level of the round ligament remnant The right ureter was identified transperitoneally and also was well out of the way of the intended dissection with right ovarian removal  There were no other intraperitoneal abnormalities noted  At this point the [employer] robot was docked and anatomy targeting was performed I then made a small incision and placed another 12 mm surgiport using a nonbladed trocar just a little inferior to the umbilicus and the lateral to the left rectus muscle.  This was done under direct visualization without difficulty.  I placed a 12 mm here to serve as my specimen removal site.  Docking time: 12 minutes  The same procedure was carried out for introduction of the surgical port on the right equidistant from the umbilicus.  The right port was 8 mm  I used a 30 degree robotic endoscope I used a VSE in the left port and a monopolar hook and the right port An Endo Catch was used to retrieve the specimen  I then proceeded to the console Again all the above-noted findings were seen  The left adnexa was grasped and pulled away from the pelvic sidewall The monopolar hook was used and the lateral peritoneum was cut The left infundibulopelvic ligament and left ovarian blood supply was isolated and dissected from the left pelvic sidewall The VSE was used to seal and cut the vessels.  There was good hemostasis The VSE was then used to grasped the left ovary protraction away from the left pelvic sidewall The monopolar hook was used and  the dissection of the peritoneum was performed and the left ovary and tube were removed The left ovary and tube were placed in the cul-de-sac  Essentially the same procedure was carried out on the right Is a bit more adherent peritoneal adhesions on the right The VSE was used to place traction on the right  ovary to pull it away from the right pelvic sidewall The external and internal iliac artery and veins were identified as was the ureter The monopolar hook was used to dissect out the right infundibulopelvic ligament This dissection was carried down to the remnant of the round ligament The VSE was then used to seal and cut the right infundibulopelvic ligament The VSE was then used by sealing and then cutting to remove the right adnexa from the pelvic sidewall There was good hemostasis noted  Endo Catch bags were then placed in the left lateral surgical port and both specimens were removed Because of the size of the right adnexa the fascial incision was enlarged to allow removal  Again there are surgical dissection sites and infundibulopelvic ligaments were found to be hemostatic bilaterally  I then got up from the console, rescrubbed gowned and gloved once again Console time: 38 minutes  I undocked the [employer] robot from the surgical ports I removed the right and left ports under direct visualization and they were found to be hemostatic I then removed the umbilical port and allowed the gas to escape  The fascia of the left surgical port was closed using 2-0 Vicryl in a running fashion The subcutaneous tissue of this port was then reapproximated All 3 skin incisions were closed using 3-0 Vicryl in a subcuticular fashion Dermabond was placed at all 3 surgical sites  Exparel was injected at all 3 surgical sites  The patient tolerated the procedure well She experienced minimal blood loss She was taken to the recovery room in good stable condition all counts were correct x 3  She received Ancef and Toradol preoperatively  The plan is to discharge the patient from the PACU.    Florian Buff, MD 07/02/2022 9:48 AM

## 2022-07-02 NOTE — Anesthesia Preprocedure Evaluation (Signed)
Anesthesia Evaluation  Patient identified by MRN, date of birth, ID band Patient awake    Reviewed: Allergy & Precautions, H&P , NPO status , Patient's Chart, lab work & pertinent test results  Airway Mallampati: I  TM Distance: >3 FB Neck ROM: Full    Dental  (+) Dental Advisory Given, Teeth Intact   Pulmonary neg pulmonary ROS, former smoker   Pulmonary exam normal breath sounds clear to auscultation       Cardiovascular hypertension, Pt. on medications Normal cardiovascular exam Rhythm:Regular Rate:Normal     Neuro/Psych  PSYCHIATRIC DISORDERS Anxiety Depression    negative neurological ROS     GI/Hepatic negative GI ROS, Neg liver ROS,,,  Endo/Other  negative endocrine ROS    Renal/GU negative Renal ROS  negative genitourinary   Musculoskeletal negative musculoskeletal ROS (+)    Abdominal   Peds negative pediatric ROS (+)  Hematology negative hematology ROS (+)   Anesthesia Other Findings   Reproductive/Obstetrics negative OB ROS                             Anesthesia Physical Anesthesia Plan  ASA: 2  Anesthesia Plan: General   Post-op Pain Management: Dilaudid IV   Induction: Intravenous  PONV Risk Score and Plan: 4 or greater and Ondansetron, Dexamethasone, Midazolam and Scopolamine patch - Pre-op  Airway Management Planned: Oral ETT  Additional Equipment:   Intra-op Plan:   Post-operative Plan: Extubation in OR  Informed Consent: I have reviewed the patients History and Physical, chart, labs and discussed the procedure including the risks, benefits and alternatives for the proposed anesthesia with the patient or authorized representative who has indicated his/her understanding and acceptance.     Dental advisory given  Plan Discussed with: CRNA and Surgeon  Anesthesia Plan Comments:         Anesthesia Quick Evaluation

## 2022-07-03 ENCOUNTER — Encounter: Payer: Self-pay | Admitting: Obstetrics & Gynecology

## 2022-07-04 LAB — SURGICAL PATHOLOGY

## 2022-07-07 ENCOUNTER — Other Ambulatory Visit (HOSPITAL_COMMUNITY): Payer: BC Managed Care – PPO

## 2022-07-09 ENCOUNTER — Encounter (HOSPITAL_COMMUNITY): Payer: Self-pay | Admitting: Obstetrics & Gynecology

## 2022-07-18 ENCOUNTER — Ambulatory Visit (INDEPENDENT_AMBULATORY_CARE_PROVIDER_SITE_OTHER): Payer: BC Managed Care – PPO | Admitting: Obstetrics and Gynecology

## 2022-07-18 ENCOUNTER — Encounter: Payer: Self-pay | Admitting: Obstetrics and Gynecology

## 2022-07-18 VITALS — BP 146/92 | HR 96 | Ht 66.0 in | Wt 162.0 lb

## 2022-07-18 DIAGNOSIS — G8918 Other acute postprocedural pain: Secondary | ICD-10-CM

## 2022-07-18 MED ORDER — KETOROLAC TROMETHAMINE 10 MG PO TABS: 10.0000 mg | ORAL_TABLET | Freq: Three times a day (TID) | ORAL | 0 refills | Status: AC | PRN

## 2022-07-18 MED ORDER — HYDROCODONE-ACETAMINOPHEN 5-325 MG PO TABS: 1.0000 | ORAL_TABLET | Freq: Four times a day (QID) | ORAL | 0 refills | Status: AC | PRN

## 2022-07-18 NOTE — Progress Notes (Signed)
[name] presents with c/o post op pain. S/P Robotic BSO on 07/02/22. See Op note for additional information Pt reports due achy pain which worsens as the day progresses. Certain positions and movement worsens pain. Particularly notices after working all day Has been taking Tylenol and Vicodin, helps some. Denies any bladder dysfunction. Tolerating diet Some constipation   PE AF VSS Lungs clear Heart RRR Abd soft + BS incisions well healed        Min diffuse tenderness, no rebound  A/P Post op pain  Reviewed pain medication schedule with pt Instructed to alternat between Tylenol and Toradol. Vicodin as need. Stool softener qd. Ice and heat use reviewed. Advised to rest over the weekend. Work note for today provided F/U with post op appt

## 2022-07-24 DIAGNOSIS — R102 Pelvic and perineal pain: Secondary | ICD-10-CM

## 2022-07-24 DIAGNOSIS — N83201 Unspecified ovarian cyst, right side: Secondary | ICD-10-CM

## 2022-07-24 DIAGNOSIS — N7011 Chronic salpingitis: Secondary | ICD-10-CM

## 2022-07-25 ENCOUNTER — Telehealth (INDEPENDENT_AMBULATORY_CARE_PROVIDER_SITE_OTHER): Payer: BC Managed Care – PPO | Admitting: Obstetrics & Gynecology

## 2022-07-25 ENCOUNTER — Encounter: Payer: Self-pay | Admitting: Obstetrics & Gynecology

## 2022-07-25 DIAGNOSIS — Z9889 Other specified postprocedural states: Secondary | ICD-10-CM

## 2022-07-25 NOTE — Progress Notes (Signed)
MyChart video visit Pt is at her office I am in my office  Total time 12 minutes   HPI: Patient returns for routine postoperative follow-up having undergone Xi robotic BSO on 07/02/22.  The patient's immediate postoperative recovery has been unremarkable. Since hospital discharge the patient reports abdominal wall pain, T11 medially-->lateral area of incision.   Current Outpatient Medications: acetaminophen (TYLENOL) 500 MG tablet, Take 1,000 mg by mouth every 6 (six) hours as needed (for pain.)., Disp: , Rfl:  cetirizine (ZYRTEC) 10 MG tablet, Take 10 mg by mouth daily., Disp: , Rfl:  estradiol (ESTRACE) 2 MG tablet, TAKE 1 TABLET BY MOUTH EVERY DAY, Disp: 90 tablet, Rfl: 4 HYDROcodone-acetaminophen (NORCO/VICODIN) 5-325 MG tablet, Take 1-2 tablets by mouth every 6 (six) hours as needed., Disp: 15 tablet, Rfl: 0 ketorolac (TORADOL) 10 MG tablet, Take 1 tablet (10 mg total) by mouth every 8 (eight) hours as needed., Disp: 15 tablet, Rfl: 0 losartan-hydrochlorothiazide (HYZAAR) 50-12.5 MG tablet, Take 1 tablet by mouth daily., Disp: 30 tablet, Rfl: 3 Vitamin D, Ergocalciferol, (DRISDOL) 1.25 MG (50000 UNIT) CAPS capsule, Take 50,000 Units by mouth 2 (two) times a week., Disp: , Rfl:   No current facility-administered medications for this visit. Facility-Administered Medications Ordered in Other Visits: bupivacaine liposome (EXPAREL) 1.3 % injection 266 mg, 20 mL, Infiltration, Once, Veria Stradley, Mertie Clause, MD     There were no vitals taken for this visit.  Physical Exam: All 3 incisions look good Of course LLQ is bigger-->specimen extraction  Diagnostic Tests:   Pathology: benign  Impression + Management plan:   ICD-10-CM   1. Post-operative state: Xi robotic BSO  Z98.890    benign pathology with normal post op course         Medications Prescribed this encounter: No orders of the defined types were placed in this encounter.     Follow up: prn   Florian Buff,  MD Attending Physician for the [facility] 07/25/2022 11:00 AM

## 2022-08-31 ENCOUNTER — Other Ambulatory Visit: Payer: Self-pay | Admitting: Obstetrics & Gynecology

## 2022-09-03 ENCOUNTER — Encounter: Payer: Self-pay | Admitting: Obstetrics & Gynecology

## 2022-09-03 MED ORDER — LOSARTAN POTASSIUM-HCTZ 50-12.5 MG PO TABS: 1.0000 | ORAL_TABLET | Freq: Every day | ORAL | 11 refills | Status: DC

## 2023-03-26 ENCOUNTER — Other Ambulatory Visit: Payer: Self-pay | Admitting: Internal Medicine

## 2023-03-26 DIAGNOSIS — Z1231 Encounter for screening mammogram for malignant neoplasm of breast: Secondary | ICD-10-CM

## 2023-04-24 ENCOUNTER — Ambulatory Visit
Admission: RE | Admit: 2023-04-24 | Discharge: 2023-04-24 | Disposition: A | Discharge status: Home / Self Care | Payer: BC Managed Care – PPO | Source: Ambulatory Visit

## 2023-04-24 DIAGNOSIS — Z1231 Encounter for screening mammogram for malignant neoplasm of breast: Secondary | ICD-10-CM

## 2023-04-29 ENCOUNTER — Ambulatory Visit: Payer: BC Managed Care – PPO

## 2023-04-29 ENCOUNTER — Other Ambulatory Visit: Payer: Self-pay | Admitting: Internal Medicine

## 2023-04-29 ENCOUNTER — Ambulatory Visit
Admission: RE | Admit: 2023-04-29 | Discharge: 2023-04-29 | Disposition: A | Discharge status: Home / Self Care | Payer: BC Managed Care – PPO | Source: Ambulatory Visit | Attending: Internal Medicine | Admitting: Internal Medicine

## 2023-04-29 DIAGNOSIS — R928 Other abnormal and inconclusive findings on diagnostic imaging of breast: Secondary | ICD-10-CM

## 2023-04-29 DIAGNOSIS — N6310 Unspecified lump in the right breast, unspecified quadrant: Secondary | ICD-10-CM

## 2023-04-30 ENCOUNTER — Ambulatory Visit
Admission: RE | Admit: 2023-04-30 | Discharge: 2023-04-30 | Disposition: A | Discharge status: Home / Self Care | Payer: BC Managed Care – PPO | Source: Ambulatory Visit | Attending: Internal Medicine | Admitting: Internal Medicine

## 2023-04-30 DIAGNOSIS — N6310 Unspecified lump in the right breast, unspecified quadrant: Secondary | ICD-10-CM

## 2023-04-30 HISTORY — PX: BREAST BIOPSY: SHX20

## 2023-05-01 LAB — SURGICAL PATHOLOGY

## 2023-05-29 ENCOUNTER — Other Ambulatory Visit: Payer: BC Managed Care – PPO

## 2023-09-18 ENCOUNTER — Other Ambulatory Visit: Payer: Self-pay | Admitting: Obstetrics & Gynecology

## 2024-05-27 ENCOUNTER — Encounter: Payer: Self-pay | Admitting: Obstetrics & Gynecology

## 2024-05-27 ENCOUNTER — Other Ambulatory Visit: Payer: Self-pay | Admitting: Internal Medicine

## 2024-05-27 DIAGNOSIS — N951 Menopausal and female climacteric states: Secondary | ICD-10-CM

## 2024-05-27 DIAGNOSIS — Z1231 Encounter for screening mammogram for malignant neoplasm of breast: Secondary | ICD-10-CM

## 2024-05-30 MED ORDER — ESTRADIOL 2 MG PO TABS: 2.0000 mg | ORAL_TABLET | Freq: Every day | ORAL | 4 refills | Status: AC

## 2024-06-09 ENCOUNTER — Ambulatory Visit
Admission: RE | Admit: 2024-06-09 | Discharge: 2024-06-09 | Disposition: A | Discharge status: Home / Self Care | Source: Ambulatory Visit | Attending: Internal Medicine | Admitting: Internal Medicine

## 2024-06-09 DIAGNOSIS — Z1231 Encounter for screening mammogram for malignant neoplasm of breast: Secondary | ICD-10-CM
